# Patient Record
Sex: Female | Born: 1980 | Race: White | Hispanic: No | Smoking: Current every day smoker
Health system: Southern US, Community
[De-identification: ages and names within clinical notes are randomized; demographics above are authoritative.]

## PROBLEM LIST (undated history)

## (undated) DIAGNOSIS — R87629 Unspecified abnormal cytological findings in specimens from vagina: Secondary | ICD-10-CM

## (undated) DIAGNOSIS — F99 Mental disorder, not otherwise specified: Secondary | ICD-10-CM

## (undated) DIAGNOSIS — Z8669 Personal history of other diseases of the nervous system and sense organs: Secondary | ICD-10-CM

## (undated) DIAGNOSIS — B9689 Other specified bacterial agents as the cause of diseases classified elsewhere: Secondary | ICD-10-CM

## (undated) DIAGNOSIS — M549 Dorsalgia, unspecified: Secondary | ICD-10-CM

## (undated) DIAGNOSIS — M797 Fibromyalgia: Secondary | ICD-10-CM

## (undated) DIAGNOSIS — N760 Acute vaginitis: Secondary | ICD-10-CM

## (undated) DIAGNOSIS — M1990 Unspecified osteoarthritis, unspecified site: Secondary | ICD-10-CM

## (undated) DIAGNOSIS — I10 Essential (primary) hypertension: Secondary | ICD-10-CM

## (undated) DIAGNOSIS — R87810 Cervical high risk human papillomavirus (HPV) DNA test positive: Secondary | ICD-10-CM

## (undated) HISTORY — DX: Acute vaginitis: N76.0

## (undated) HISTORY — DX: Mental disorder, not otherwise specified: F99

## (undated) HISTORY — DX: Essential (primary) hypertension: I10

## (undated) HISTORY — PX: CHOLECYSTECTOMY: SHX55

## (undated) HISTORY — PX: TONSILLECTOMY: SUR1361

## (undated) HISTORY — DX: Unspecified abnormal cytological findings in specimens from vagina: R87.629

## (undated) HISTORY — DX: Other specified bacterial agents as the cause of diseases classified elsewhere: B96.89

## (undated) HISTORY — DX: Personal history of other diseases of the nervous system and sense organs: Z86.69

## (undated) HISTORY — DX: Cervical high risk human papillomavirus (HPV) DNA test positive: R87.810

## (undated) HISTORY — PX: BACK SURGERY: SHX140

---

## 2000-09-11 ENCOUNTER — Other Ambulatory Visit: Admission: RE | Admit: 2000-09-11 | Discharge: 2000-09-11 | Payer: Self-pay | Admitting: Obstetrics and Gynecology

## 2004-08-02 ENCOUNTER — Emergency Department (HOSPITAL_COMMUNITY): Admission: EM | Admit: 2004-08-02 | Discharge: 2004-08-02 | Payer: Self-pay | Admitting: Emergency Medicine

## 2007-02-24 ENCOUNTER — Emergency Department (HOSPITAL_COMMUNITY): Admission: EM | Admit: 2007-02-24 | Discharge: 2007-02-25 | Payer: Self-pay | Admitting: Emergency Medicine

## 2007-10-15 ENCOUNTER — Other Ambulatory Visit: Admission: RE | Admit: 2007-10-15 | Discharge: 2007-10-15 | Payer: Self-pay | Admitting: Obstetrics & Gynecology

## 2008-02-27 ENCOUNTER — Emergency Department (HOSPITAL_COMMUNITY): Admission: EM | Admit: 2008-02-27 | Discharge: 2008-02-27 | Payer: Self-pay | Admitting: Emergency Medicine

## 2008-10-01 ENCOUNTER — Ambulatory Visit (HOSPITAL_COMMUNITY): Admission: RE | Admit: 2008-10-01 | Discharge: 2008-10-01 | Payer: Self-pay | Admitting: Family Medicine

## 2008-10-20 ENCOUNTER — Ambulatory Visit (HOSPITAL_COMMUNITY): Admission: RE | Admit: 2008-10-20 | Discharge: 2008-10-20 | Payer: Self-pay | Admitting: Family Medicine

## 2009-02-11 ENCOUNTER — Inpatient Hospital Stay (HOSPITAL_COMMUNITY): Admission: RE | Admit: 2009-02-11 | Discharge: 2009-02-14 | Payer: Self-pay | Admitting: Neurosurgery

## 2009-03-01 ENCOUNTER — Emergency Department (HOSPITAL_COMMUNITY): Admission: EM | Admit: 2009-03-01 | Discharge: 2009-03-01 | Payer: Self-pay | Admitting: Emergency Medicine

## 2009-05-21 ENCOUNTER — Other Ambulatory Visit: Admission: RE | Admit: 2009-05-21 | Discharge: 2009-05-21 | Payer: Self-pay | Admitting: Obstetrics and Gynecology

## 2009-11-05 ENCOUNTER — Emergency Department (HOSPITAL_COMMUNITY): Admission: EM | Admit: 2009-11-05 | Discharge: 2009-11-05 | Payer: Self-pay | Admitting: Emergency Medicine

## 2010-05-20 IMAGING — CR DG ABDOMEN 1V
1 series · 1 of 1 positions shown · non-contrast
Comparison: Lumbar spine radiographs 10/01/2008

CLINICAL DATA: Constipation

ABDOMEN - 1 VIEW

[view not recorded]
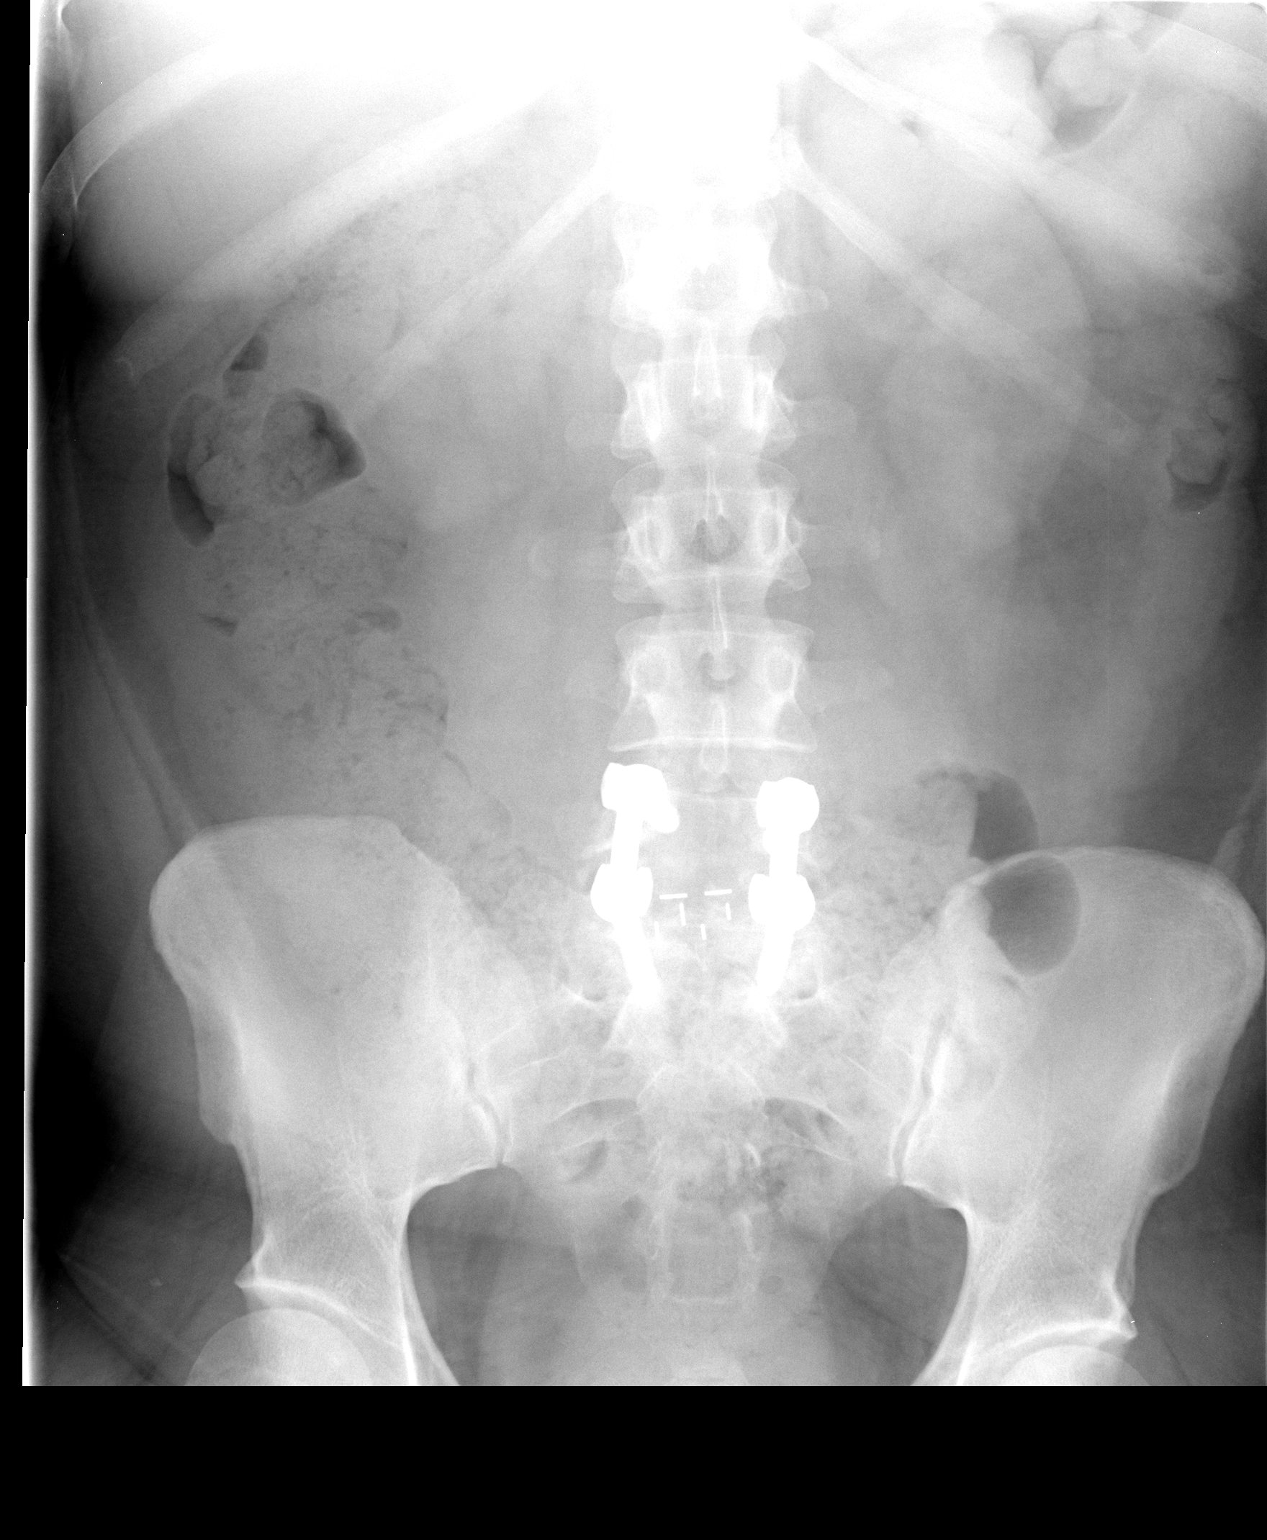

[1 of 1 positions shown; findings below may reference images not displayed]

FINDINGS: Evidence of L5 - S1 lumbar fusion noted.  Moderate amount
of stool noted over nondilated colon.  Presence or absence of air
fluid levels or free air cannot be assessed on this supine
projection.
IMPRESSION: Moderate stool burden.  No acute finding otherwise.

## 2010-07-06 ENCOUNTER — Other Ambulatory Visit (HOSPITAL_COMMUNITY): Payer: Self-pay | Admitting: Family Medicine

## 2010-07-06 DIAGNOSIS — R1011 Right upper quadrant pain: Secondary | ICD-10-CM

## 2010-07-07 ENCOUNTER — Ambulatory Visit (HOSPITAL_COMMUNITY)
Admission: RE | Admit: 2010-07-07 | Discharge: 2010-07-07 | Disposition: A | Discharge status: Home / Self Care | Payer: BC Managed Care – PPO | Source: Ambulatory Visit | Attending: Family Medicine | Admitting: Family Medicine

## 2010-07-07 DIAGNOSIS — R1011 Right upper quadrant pain: Secondary | ICD-10-CM

## 2010-07-07 DIAGNOSIS — K829 Disease of gallbladder, unspecified: Secondary | ICD-10-CM | POA: Insufficient documentation

## 2010-07-12 ENCOUNTER — Other Ambulatory Visit (HOSPITAL_COMMUNITY): Payer: Self-pay | Admitting: Family Medicine

## 2010-07-12 ENCOUNTER — Encounter (HOSPITAL_COMMUNITY): Payer: Self-pay

## 2010-07-12 ENCOUNTER — Ambulatory Visit (HOSPITAL_COMMUNITY)
Admission: RE | Admit: 2010-07-12 | Discharge: 2010-07-12 | Disposition: A | Discharge status: Home / Self Care | Payer: BC Managed Care – PPO | Source: Ambulatory Visit | Attending: Family Medicine | Admitting: Family Medicine

## 2010-07-12 DIAGNOSIS — R1011 Right upper quadrant pain: Secondary | ICD-10-CM | POA: Insufficient documentation

## 2010-07-12 DIAGNOSIS — R932 Abnormal findings on diagnostic imaging of liver and biliary tract: Secondary | ICD-10-CM | POA: Insufficient documentation

## 2010-07-12 MED ORDER — TECHNETIUM TC 99M MEBROFENIN IV KIT
5.0000 | PACK | Freq: Once | INTRAVENOUS | Status: AC | PRN
  Administered 2010-07-12: 4.9 via INTRAVENOUS

## 2010-07-16 ENCOUNTER — Encounter (HOSPITAL_COMMUNITY): Payer: BC Managed Care – PPO

## 2010-07-16 ENCOUNTER — Other Ambulatory Visit: Payer: Self-pay | Admitting: General Surgery

## 2010-07-16 LAB — BASIC METABOLIC PANEL
BUN: 10 mg/dL (ref 6–23)
Creatinine, Ser: 0.89 mg/dL (ref 0.4–1.2)
GFR calc Af Amer: >60 mL/min (ref >60)
Glucose, Bld: 106 mg/dL — ABNORMAL HIGH (ref 70–99)
Sodium: 139 mEq/L (ref 135–145)

## 2010-07-16 LAB — HEPATIC FUNCTION PANEL
ALT: 18 U/L (ref 0–35)
Albumin: 3.9 g/dL (ref 3.5–5.2)
Alkaline Phosphatase: 58 U/L (ref 39–117)
Bilirubin, Direct: 0.2 mg/dL (ref 0.0–0.3)
Indirect Bilirubin: 0.3 mg/dL (ref 0.3–0.9)
Total Bilirubin: 0.5 mg/dL (ref 0.3–1.2)
Total Protein: 6.3 g/dL (ref 6.0–8.3)

## 2010-07-16 LAB — CBC
MCH: 30.3 pg (ref 26.0–34.0)
MCV: 88.6 fL (ref 78.0–100.0)
Platelets: 262 10*3/uL (ref 150–400)
RBC: 4.66 MIL/uL (ref 3.87–5.11)
RDW: 12.9 % (ref 11.5–15.5)
WBC: 10.6 10*3/uL — ABNORMAL HIGH (ref 4.0–10.5)

## 2010-07-16 LAB — SURGICAL PCR SCREEN: Staphylococcus aureus: NEGATIVE

## 2010-07-16 LAB — HCG, QUANTITATIVE, PREGNANCY: hCG, Beta Chain, Quant, S: <2 m[IU]/mL (ref <5)

## 2010-07-17 NOTE — H&P (Signed)
  Jennifer Gonzales, Jennifer Gonzales         ACCOUNT NO.:  [id number]  MEDICAL RECORD NO.:  [id number]           PATIENT TYPE:  O  LOCATION:  DAY                           FACILITY:  [facility]  PHYSICIAN:  Dalia Heading, M.D.  DATE OF BIRTH:  Jun 10, 1980  DATE OF ADMISSION: DATE OF DISCHARGE:  LH                             HISTORY & PHYSICAL   CHIEF COMPLAINT:  Chronic cholecystitis.  HISTORY OF PRESENT ILLNESS:  The patient is a 30 year old white female who is referred for evaluation and treatment of biliary colic secondary to chronic cholecystitis.  She has been having intermittent right upper quadrant abdominal pain with radiation to the flank, nausea, and indigestion for the past few weeks.  It is made worse with fatty foods. No fever, chills, jaundice have been noted.  PAST MEDICAL HISTORY:  Includes fibromyalgia.  PAST SURGICAL HISTORY:  C-sections, back surgery.  CURRENT MEDICATIONS:  Topiramate 25 mg p.o. at bedtime.  ALLERGIES:  TORADOL.  REVIEW OF SYSTEMS:  The patient does smoke tobacco socially.  She drinks alcohol socially.  She denies any other cardiopulmonary difficulties or bleeding disorders.  FAMILY MEDICAL HISTORY:  Noncontributory.  PHYSICAL EXAMINATION:  GENERAL:  The patient is a well-developed, well- nourished white female, in no acute distress. HEENT:  Reveals no scleral icterus. LUNGS:  Clear to auscultation with equal breath sounds bilaterally. HEART:  Reveals regular rate and rhythm without S3, S4, or murmurs. ABDOMEN:  Soft, nondistended.  She is tender in the right upper quadrant to palpation.  No hepatosplenomegaly, masses, hernias identified.  Ultrasound of the gallbladder is reportedly negative.  HIDA scan reveals chronic cholecystitis with a low gallbladder ejection fraction reproducible symptoms.  IMPRESSION:  Chronic cholecystitis.  PLAN:  The patient is scheduled for laparoscopic cholecystectomy on July 19, 2010.  The risks and benefits of the  procedure including bleeding, infection, hepatobiliary injury, and the possibility of an open procedure were fully explained to the patient, gave informed consent.     Dalia Heading, M.D.     MAJ/MEDQ  D:  07/15/2010  T:  07/16/2010  Job:  [id number]  cc:   Short Stay, Nadara Mode, M.D. Fax: [phone]  Electronically Signed by Franky Macho M.D. on 07/17/2010 10:05:41 AM

## 2010-07-19 ENCOUNTER — Ambulatory Visit (HOSPITAL_COMMUNITY)
Admission: RE | Admit: 2010-07-19 | Discharge: 2010-07-19 | Disposition: A | Discharge status: Home / Self Care | Payer: BC Managed Care – PPO | Source: Ambulatory Visit | Attending: General Surgery | Admitting: General Surgery

## 2010-07-19 ENCOUNTER — Other Ambulatory Visit: Payer: Self-pay | Admitting: General Surgery

## 2010-07-19 DIAGNOSIS — K811 Chronic cholecystitis: Secondary | ICD-10-CM | POA: Insufficient documentation

## 2010-07-19 DIAGNOSIS — Z01812 Encounter for preprocedural laboratory examination: Secondary | ICD-10-CM | POA: Insufficient documentation

## 2010-07-19 DIAGNOSIS — Z01818 Encounter for other preprocedural examination: Secondary | ICD-10-CM | POA: Insufficient documentation

## 2010-07-22 NOTE — Op Note (Signed)
Jennifer Gonzales, Jennifer Gonzales         ACCOUNT NO.:  [id number]  MEDICAL RECORD NO.:  [id number]           PATIENT TYPE:  O  LOCATION:  DAYP                          FACILITY:  [facility]  PHYSICIAN:  Dalia Heading, M.D.  DATE OF BIRTH:  [birth year]  DATE OF PROCEDURE:  07/19/2010 DATE OF DISCHARGE:                              OPERATIVE REPORT   PREOPERATIVE DIAGNOSIS:  Chronic cholecystitis.  POSTOPERATIVE DIAGNOSIS:  Chronic cholecystitis.  PROCEDURE:  Laparoscopic cholecystectomy.  SURGEON:  Dalia Heading, MD  ANESTHESIA:  General endotracheal.  INDICATIONS:  The patient is a 30 year old white female who presents with biliary colic secondary to chronic cholecystitis.  Risks and benefits of the procedure including bleeding, infection, hepatobiliary injury, and the possibility of an open procedure were fully explained to the patient, gave informed consent.  PROCEDURE NOTE:  The patient was placed in supine position.  After induction of general endotracheal anesthesia, the abdomen was prepped and draped in the usual sterile technique with DuraPrep.  Surgical site confirmation was performed.  The supraumbilical incision was made down to the fascia.  A Veress needle was introduced into the abdominal cavity and confirmation of placement was done using the saline drop test.  The abdomen was then insufflated to 16 mmHg of pressure.  An #11 mm trocar was introduced into the abdominal cavity under direct visualization without difficulty. The patient was placed in a reversed Trendelenburg position.  An additional 11-mm trocar was placed in the epigastric region and 5-mm trocars were placed in the right upper quadrant and right flank regions. Liver was inspected and noted to be within normal limits.  The gallbladder was retracted superiorly and laterally.  Dissection was begun around the infundibulum of the gallbladder.  The cystic duct was first identified.  She juncture to the  infundibulum was fully identified.  Endoclip was placed proximally and distally on the cystic duct and the cystic duct was divided.  This likewise was done on the cystic artery.  The gallbladder was then freed away from the gallbladder fossa using Bovie electrocautery.  The gallbladder was delivered through the epigastric trocar site using Endocatch bag.  The gallbladder fossa was inspected and no abnormal bleeding or bile leakage was noted. Surgicel was placed in the gallbladder fossa.  All fluid and air were then evacuated from the abdominal cavity prior to removal of the trocars.  All wounds were irrigated with normal saline.  All wounds were injected with 0.5% Sensorcaine.  The supraumbilical fascia was reapproximated using an 0 Vicryl interrupted suture.  All skin incisions were closed using staples.  Betadine ointment and dry sterile dressings were applied.  All tape and needle counts were correct at the end of the procedure. The patient was extubated in the operating room and went back to the recovery room awake and stable condition.  COMPLICATIONS:  None.  SPECIMEN:  Gallbladder.  BLOOD LOSS:  Minimal.     Dalia Heading, M.D.     MAJ/MEDQ  D:  07/19/2010  T:  07/20/2010  Job:  [id number]  cc:   Kirk Ruths, M.D. Fax: [phone]  Electronically Signed by Franky Macho M.D. on 07/21/2010  08:24:06 AM

## 2010-08-20 LAB — TYPE AND SCREEN
ABO/RH(D): O POS
Antibody Screen: NEGATIVE

## 2010-08-20 LAB — CBC
Hemoglobin: 15.2 g/dL — ABNORMAL HIGH (ref 12.0–15.0)
Platelets: 240 10*3/uL (ref 150–400)

## 2010-08-20 LAB — ABO/RH: ABO/RH(D): O POS

## 2011-02-24 LAB — PREGNANCY, URINE: Preg Test, Ur: NEGATIVE

## 2011-03-12 DIAGNOSIS — F17200 Nicotine dependence, unspecified, uncomplicated: Secondary | ICD-10-CM | POA: Insufficient documentation

## 2011-03-12 DIAGNOSIS — J40 Bronchitis, not specified as acute or chronic: Secondary | ICD-10-CM | POA: Insufficient documentation

## 2011-03-12 DIAGNOSIS — M2669 Other specified disorders of temporomandibular joint: Secondary | ICD-10-CM | POA: Insufficient documentation

## 2011-03-12 DIAGNOSIS — R05 Cough: Secondary | ICD-10-CM | POA: Insufficient documentation

## 2011-03-12 DIAGNOSIS — IMO0001 Reserved for inherently not codable concepts without codable children: Secondary | ICD-10-CM | POA: Insufficient documentation

## 2011-03-12 DIAGNOSIS — R059 Cough, unspecified: Secondary | ICD-10-CM | POA: Insufficient documentation

## 2011-03-13 ENCOUNTER — Emergency Department (HOSPITAL_COMMUNITY)
Admission: EM | Admit: 2011-03-13 | Discharge: 2011-03-13 | Disposition: A | Discharge status: Home / Self Care | Payer: BC Managed Care – PPO | Attending: Emergency Medicine | Admitting: Emergency Medicine

## 2011-03-13 ENCOUNTER — Encounter (HOSPITAL_COMMUNITY): Payer: Self-pay | Admitting: *Deleted

## 2011-03-13 DIAGNOSIS — M26629 Arthralgia of temporomandibular joint, unspecified side: Secondary | ICD-10-CM

## 2011-03-13 DIAGNOSIS — J40 Bronchitis, not specified as acute or chronic: Secondary | ICD-10-CM

## 2011-03-13 HISTORY — DX: Dorsalgia, unspecified: M54.9

## 2011-03-13 HISTORY — DX: Fibromyalgia: M79.7

## 2011-03-13 LAB — RAPID STREP SCREEN (MED CTR MEBANE ONLY): Streptococcus, Group A Screen (Direct): NEGATIVE

## 2011-03-13 MED ORDER — HYDROCOD POLST-CHLORPHEN POLST 10-8 MG/5ML PO LQCR: ORAL | Status: DC

## 2011-03-13 NOTE — ED Notes (Signed)
Pt reports sore throat x 2 weeks  

## 2011-03-13 NOTE — ED Provider Notes (Signed)
History     CSN: [id number] Arrival date & time: 03/13/2011 12:17 AM   First MD Initiated Contact with Patient 03/13/11 0114      Chief Complaint  Patient presents with  . Sore Throat    (Consider location/radiation/quality/duration/timing/severity/associated sxs/prior treatment) HPI This is a 30 year old white female with a two-week history of respiratory symptoms. These include cough, nasal congestion and sore throat. The symptoms are persisting. She's not had a fever. She states the sore throat is primarily felt in her jaw bilaterally, worse with movement of the jaw. Her daughter, who accompanies her, tested positive for strep throat but the patient's strep test was negative.  Past Medical History  Diagnosis Date  . Fibromyalgia   . Back pain     Past Surgical History  Procedure Date  . Cholecystectomy   . Back surgery   . Tonsillectomy   . Cesarean section     No family history on file.  History  Substance Use Topics  . Smoking status: Current Some Day Smoker  . Smokeless tobacco: Not on file  . Alcohol Use: No    OB History    Grav Para Term Preterm Abortions TAB SAB Ect Mult Living                  Review of Systems  All other systems reviewed and are negative.    Allergies  Toradol  Home Medications   Current Outpatient Rx  Name Route Sig Dispense Refill  . CETIRIZINE HCL 10 MG PO TABS Oral Take 10 mg by mouth daily.      Marland Kitchen ESCITALOPRAM OXALATE 10 MG PO TABS Oral Take 10 mg by mouth daily.      Marland Kitchen MELATONIN 10 MG PO TABS Oral Take by mouth at bedtime.      . TOPIRAMATE 50 MG PO TABS Oral Take 50 mg by mouth once.        BP 118/83  Pulse 94  Temp(Src) 98.5 F (36.9 C) (Oral)  Resp 20  Ht 4\' 11"  (1.499 m)  Wt 187 lb (84.823 kg)  BMI 37.77 kg/m2  SpO2 99%  Physical Exam General: Well-developed, well-nourished female in no acute distress; appearance consistent with age of record HENT: normocephalic, atraumatic; no pharyngeal erythema or  exudate; mild TMJ tenderness on movement of jaw Eyes: pupils equal round and reactive to light; extraocular muscles intact Neck: supple; no cervical   the cervical rectum lymphadenopathy Heart: regular rate and rhythm Lungs: clear to auscultation bilaterally Abdomen: soft; nontender; nondistended; no masses or hepatosplenomegaly Extremities: No deformity; full range of motion Neurologic: Awake, alert and oriented;motor function intact in all extremities and symmetric; no facial droop Skin: Warm and dry Psychiatric: Normal mood and affect    ED Course  Procedures (including critical care time)    MDM   Nursing notes and vitals signs, including pulse oximetry, reviewed.  Summary of this visit's results, reviewed by myself:  Labs:  Results for orders placed during the hospital encounter of 03/13/11  RAPID STREP SCREEN      Component Value Range   Streptococcus, Group A Screen (Direct) NEGATIVE  NEGATIVE    Suspect persistent cough is due to viral bronchitis. This has been seen commonly in the community the past several weeks. 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          Hanley Seamen, MD 03/13/11 [phone]

## 2011-03-13 NOTE — Discharge Instructions (Signed)
Bronchitis Bronchitis is the body's way of reacting to injury and/or infection (inflammation) of the bronchi. Bronchi are the air tubes that extend from the windpipe into the lungs. If the inflammation becomes severe, it may cause shortness of breath. CAUSES  Inflammation may be caused by:  A virus.   Germs (bacteria).   Dust.   Allergens.   Pollutants and many other irritants.  The cells lining the bronchial tree are covered with tiny hairs (cilia). These constantly beat upward, away from the lungs, toward the mouth. This keeps the lungs free of pollutants. When these cells become too irritated and are unable to do their job, mucus begins to develop. This causes the characteristic cough of bronchitis. The cough clears the lungs when the cilia are unable to do their job. Without either of these protective mechanisms, the mucus would settle in the lungs. Then you would develop pneumonia. Smoking is a common cause of bronchitis and can contribute to pneumonia. Stopping this habit is the single most important thing you can do to help yourself. TREATMENT   Your caregiver may prescribe an antibiotic if the cough is caused by bacteria. Also, medicines that open up your airways make it easier to breathe. Your caregiver may also recommend or prescribe an expectorant. It will loosen the mucus to be coughed up. Only take over-the-counter or prescription medicines for pain, discomfort, or fever as directed by your caregiver.   Removing whatever causes the problem (smoking, for example) is critical to preventing the problem from getting worse.   Cough suppressants may be prescribed for relief of cough symptoms.   Inhaled medicines may be prescribed to help with symptoms now and to help prevent problems from returning.   For those with recurrent (chronic) bronchitis, there may be a need for steroid medicines.  SEEK IMMEDIATE MEDICAL CARE IF:   During treatment, you develop more pus-like mucus  (purulent sputum).   You have a fever.   You become progressively more ill.   You have increased difficulty breathing, wheezing, or shortness of breath.  It is necessary to seek immediate medical care if you are elderly or sick from any other disease. MAKE SURE YOU:   Understand these instructions.   Will watch your condition.   Will get help right away if you are not doing well or get worse.  Document Released: 05/02/2005 Document Revised: 01/12/2011 Document Reviewed: 03/11/2008 [employer] Patient Information 2012 [employer]. 

## 2011-06-09 ENCOUNTER — Other Ambulatory Visit: Payer: Self-pay | Admitting: Adult Health

## 2011-06-09 ENCOUNTER — Other Ambulatory Visit (HOSPITAL_COMMUNITY)
Admission: RE | Admit: 2011-06-09 | Discharge: 2011-06-09 | Disposition: A | Discharge status: Home / Self Care | Payer: BC Managed Care – PPO | Source: Ambulatory Visit | Attending: Obstetrics and Gynecology | Admitting: Obstetrics and Gynecology

## 2011-06-09 DIAGNOSIS — Z1159 Encounter for screening for other viral diseases: Secondary | ICD-10-CM | POA: Insufficient documentation

## 2011-06-09 DIAGNOSIS — Z01419 Encounter for gynecological examination (general) (routine) without abnormal findings: Secondary | ICD-10-CM | POA: Insufficient documentation

## 2011-12-15 DIAGNOSIS — R6884 Jaw pain: Secondary | ICD-10-CM | POA: Insufficient documentation

## 2011-12-15 DIAGNOSIS — Z79899 Other long term (current) drug therapy: Secondary | ICD-10-CM | POA: Insufficient documentation

## 2011-12-15 DIAGNOSIS — IMO0001 Reserved for inherently not codable concepts without codable children: Secondary | ICD-10-CM | POA: Insufficient documentation

## 2011-12-15 DIAGNOSIS — Z886 Allergy status to analgesic agent status: Secondary | ICD-10-CM | POA: Insufficient documentation

## 2011-12-15 DIAGNOSIS — F17200 Nicotine dependence, unspecified, uncomplicated: Secondary | ICD-10-CM | POA: Insufficient documentation

## 2011-12-15 NOTE — ED Notes (Signed)
Right upper jaw pain since Sunday, painful to chew, using otc meds without relief.

## 2011-12-16 ENCOUNTER — Emergency Department (HOSPITAL_COMMUNITY)
Admission: EM | Admit: 2011-12-16 | Discharge: 2011-12-16 | Disposition: A | Discharge status: Home / Self Care | Payer: BC Managed Care – PPO | Attending: Emergency Medicine | Admitting: Emergency Medicine

## 2011-12-16 ENCOUNTER — Encounter (HOSPITAL_COMMUNITY): Payer: Self-pay | Admitting: *Deleted

## 2011-12-16 DIAGNOSIS — M26629 Arthralgia of temporomandibular joint, unspecified side: Secondary | ICD-10-CM

## 2011-12-16 MED ORDER — OXYCODONE-ACETAMINOPHEN 5-325 MG PO TABS: 2.0000 | ORAL_TABLET | ORAL | Status: AC | PRN

## 2011-12-16 MED ORDER — IBUPROFEN 800 MG PO TABS
800.0000 mg | ORAL_TABLET | Freq: Once | ORAL | Status: AC
  Administered 2011-12-16: 800 mg via ORAL
  Filled 2011-12-16: qty 1

## 2011-12-16 NOTE — ED Notes (Signed)
Pt alert & oriented x4, stable gait. Patient given discharge instructions, paperwork & prescription(s). Patient  instructed to stop at the registration desk to finish any additional paperwork. Patient verbalized understanding. Pt left department w/ no further questions. 

## 2011-12-16 NOTE — ED Provider Notes (Signed)
History     CSN: [id number]  Arrival date & time 12/15/11  2334   First MD Initiated Contact with Patient 12/16/11 [phone]      Chief Complaint  Patient presents with  . Jaw Pain    (Consider location/radiation/quality/duration/timing/severity/associated sxs/prior treatment) HPI Pt reports several days of moderate to severe aching pain in R jaw, worse with chewing. No injury, no toothache. 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 Has tried Motrin 400mg  without improvement.   Past Medical History  Diagnosis Date  . Fibromyalgia   . Back pain     Past Surgical History  Procedure Date  . Cholecystectomy   . Back surgery   . Tonsillectomy   . Cesarean section     No family history on file.  History  Substance Use Topics  . Smoking status: Current Some Day Smoker  . Smokeless tobacco: Not on file  . Alcohol Use: No    OB History    Grav Para Term Preterm Abortions TAB SAB Ect Mult Living                  Review of Systems All other systems reviewed and are negative except as noted in HPI.   Allergies  Ketorolac tromethamine  Home Medications   Current Outpatient Rx  Name Route Sig Dispense Refill  . ACETAMINOPHEN 325 MG PO TABS Oral Take 650 mg by mouth every 6 (six) hours as needed.    Marland Kitchen CETIRIZINE HCL 10 MG PO TABS Oral Take 10 mg by mouth daily.      Marland Kitchen ESCITALOPRAM OXALATE 10 MG PO TABS Oral Take 10 mg by mouth daily.      . IBUPROFEN 200 MG PO TABS Oral Take 200 mg by mouth every 6 (six) hours as needed.    . NORETHINDRONE 0.35 MG PO TABS Oral Take 1 tablet by mouth daily.    . TOPIRAMATE 50 MG PO TABS Oral Take 50 mg by mouth once.      Marland Kitchen HYDROCOD POLST-CPM POLST ER 10-8 MG/5ML PO LQCR  Take 5 L every 12 hours as needed for cough. 115 mL 0  . MELATONIN 10 MG PO TABS Oral Take by mouth at bedtime.        BP 110/74  Pulse 89  Temp 98.4 F (36.9 C) (Oral)  Resp 16  SpO2 100%  LMP 12/05/2011  Physical Exam  Constitutional: She is oriented to person,  place, and time. She appears well-developed and well-nourished.  HENT:  Head: Normocephalic and atraumatic.    Right Ear: Tympanic membrane, external ear and ear canal normal.  Left Ear: Tympanic membrane, external ear and ear canal normal.  Neck: Neck supple.  Pulmonary/Chest: Effort normal.  Neurological: She is alert and oriented to person, place, and time. No cranial nerve deficit.  Psychiatric: She has a normal mood and affect. Her behavior is normal.    ED Course  Procedures (including critical care time)  Labs Reviewed - No data to display No results found.   No diagnosis found.    MDM  Symptoms consistent with TMJ. Advised NSAIDs (which she can take despite allergy to Toradol listed), percocet for breakthrough. PCP followup.         Tu Bayle B. Bernette Mayers, MD 12/16/11 [phone]

## 2012-03-11 ENCOUNTER — Emergency Department (HOSPITAL_COMMUNITY)
Admission: EM | Admit: 2012-03-11 | Discharge: 2012-03-11 | Disposition: A | Discharge status: Home / Self Care | Payer: BC Managed Care – PPO | Attending: Emergency Medicine | Admitting: Emergency Medicine

## 2012-03-11 ENCOUNTER — Encounter (HOSPITAL_COMMUNITY): Payer: Self-pay | Admitting: Emergency Medicine

## 2012-03-11 ENCOUNTER — Emergency Department (HOSPITAL_COMMUNITY): Payer: BC Managed Care – PPO

## 2012-03-11 DIAGNOSIS — S8390XA Sprain of unspecified site of unspecified knee, initial encounter: Secondary | ICD-10-CM

## 2012-03-11 DIAGNOSIS — Z8739 Personal history of other diseases of the musculoskeletal system and connective tissue: Secondary | ICD-10-CM | POA: Insufficient documentation

## 2012-03-11 DIAGNOSIS — Y929 Unspecified place or not applicable: Secondary | ICD-10-CM | POA: Insufficient documentation

## 2012-03-11 DIAGNOSIS — F17200 Nicotine dependence, unspecified, uncomplicated: Secondary | ICD-10-CM | POA: Insufficient documentation

## 2012-03-11 DIAGNOSIS — IMO0002 Reserved for concepts with insufficient information to code with codable children: Secondary | ICD-10-CM | POA: Insufficient documentation

## 2012-03-11 DIAGNOSIS — Y9351 Activity, roller skating (inline) and skateboarding: Secondary | ICD-10-CM | POA: Insufficient documentation

## 2012-03-11 MED ORDER — OXYCODONE-ACETAMINOPHEN 5-325 MG PO TABS: 1.0000 | ORAL_TABLET | ORAL | Status: DC | PRN

## 2012-03-11 MED ORDER — IBUPROFEN 800 MG PO TABS
800.0000 mg | ORAL_TABLET | Freq: Once | ORAL | Status: AC
  Administered 2012-03-11: 800 mg via ORAL
  Filled 2012-03-11: qty 1

## 2012-03-11 NOTE — ED Notes (Signed)
Pt discharged. Pt stable at time of discharge. Medications reviewed pt has no questions regarding discharge at this time. Pt voiced understanding of discharge instructions.  

## 2012-03-11 NOTE — ED Notes (Signed)
Patient states she fell while roller skating yesterday and is now complaining of left knee pain.

## 2012-03-11 NOTE — ED Provider Notes (Signed)
History     CSN: [id number]  Arrival date & time 03/11/12  0416   First MD Initiated Contact with Patient 03/11/12 [phone]      Chief Complaint  Patient presents with  . Knee Pain    Patient is a 31 y.o. female presenting with knee pain. The history is provided by the patient.  Knee Pain This is a new problem. The current episode started yesterday. The problem occurs constantly. The problem has been gradually worsening. Pertinent negatives include no chest pain, no headaches and no shortness of breath. The symptoms are aggravated by bending and standing. The symptoms are relieved by rest. She has tried rest for the symptoms. The treatment provided no relief.  pt presents with left knee pain She reports she was rollerskating yesterday and fell onto her left knee.  She reports since then her pain has worsened  She denies head injury.  No neck or back injury.  No other complaints reported  Past Medical History  Diagnosis Date  . Fibromyalgia   . Back pain     Past Surgical History  Procedure Date  . Cholecystectomy   . Back surgery   . Tonsillectomy   . Cesarean section     History reviewed. No pertinent family history.  History  Substance Use Topics  . Smoking status: Current Some Day Smoker  . Smokeless tobacco: Not on file  . Alcohol Use: No    OB History    Grav Para Term Preterm Abortions TAB SAB Ect Mult Living                  Review of Systems  Respiratory: Negative for shortness of breath.   Cardiovascular: Negative for chest pain.  Neurological: Negative for headaches.    Allergies  Ketorolac tromethamine  Home Medications   Current Outpatient Rx  Name Route Sig Dispense Refill  . ACETAMINOPHEN 325 MG PO TABS Oral Take 650 mg by mouth every 6 (six) hours as needed.    Marland Kitchen CETIRIZINE HCL 10 MG PO TABS Oral Take 10 mg by mouth daily.      Marland Kitchen HYDROCOD POLST-CPM POLST ER 10-8 MG/5ML PO LQCR  Take 5 L every 12 hours as needed for cough. 115 mL 0  .  ESCITALOPRAM OXALATE 10 MG PO TABS Oral Take 10 mg by mouth daily.      . IBUPROFEN 200 MG PO TABS Oral Take 200 mg by mouth every 6 (six) hours as needed.    Marland Kitchen MELATONIN 10 MG PO TABS Oral Take by mouth at bedtime.      . NORETHINDRONE 0.35 MG PO TABS Oral Take 1 tablet by mouth daily.    . TOPIRAMATE 50 MG PO TABS Oral Take 50 mg by mouth once.        BP 116/72  Pulse 96  Temp 98.3 F (36.8 C) (Oral)  Resp 18  Ht 4' 11.5" (1.511 m)  Wt 205 lb (92.987 kg)  BMI 40.71 kg/m2  SpO2 96%  LMP 02/25/2012  Physical Exam CONSTITUTIONAL: Well developed/well nourished HEAD AND FACE: Normocephalic/atraumatic EYES: EOMI/PERRL ENMT: Mucous membranes moist NECK: supple no meningeal signs SPINE:entire spine nontender CV: S1/S2 noted, no murmurs/rubs/gallops noted LUNGS: Lungs are clear to auscultation bilaterally, no apparent distress ABDOMEN: soft, nontender, no rebound or guarding NEURO: Pt is awake/alert, moves all extremitiesx4 EXTREMITIES: pulses normal, tenderness and erythema to left knee.  No deformity or significant signs of effusion noted.  She can keep knee extended and flex at  left hip, but she has limited ROM of left knee due to pain.  The rest of her left lower extremity is nontender and atraumatic SKIN: warm, color normal PSYCH: no abnormalities of mood noted  ED Course  Procedures   Pt stable for d/c home.  Ortho f/u advised.  Crutches offered.     MDM  Nursing notes including past medical history and social history reviewed and considered in documentation xrays reviewed and considered         Joya Gaskins, MD 03/11/12 [phone]

## 2012-04-05 ENCOUNTER — Emergency Department (HOSPITAL_COMMUNITY)
Admission: EM | Admit: 2012-04-05 | Discharge: 2012-04-06 | Disposition: A | Discharge status: Home / Self Care | Payer: BC Managed Care – PPO | Attending: Emergency Medicine | Admitting: Emergency Medicine

## 2012-04-05 ENCOUNTER — Other Ambulatory Visit: Payer: Self-pay

## 2012-04-05 ENCOUNTER — Encounter (HOSPITAL_COMMUNITY): Payer: Self-pay | Admitting: *Deleted

## 2012-04-05 DIAGNOSIS — T4272XA Poisoning by unspecified antiepileptic and sedative-hypnotic drugs, intentional self-harm, initial encounter: Secondary | ICD-10-CM | POA: Insufficient documentation

## 2012-04-05 DIAGNOSIS — M549 Dorsalgia, unspecified: Secondary | ICD-10-CM | POA: Insufficient documentation

## 2012-04-05 DIAGNOSIS — F17200 Nicotine dependence, unspecified, uncomplicated: Secondary | ICD-10-CM | POA: Insufficient documentation

## 2012-04-05 DIAGNOSIS — T426X1A Poisoning by other antiepileptic and sedative-hypnotic drugs, accidental (unintentional), initial encounter: Secondary | ICD-10-CM | POA: Insufficient documentation

## 2012-04-05 DIAGNOSIS — IMO0001 Reserved for inherently not codable concepts without codable children: Secondary | ICD-10-CM | POA: Insufficient documentation

## 2012-04-05 DIAGNOSIS — T50901A Poisoning by unspecified drugs, medicaments and biological substances, accidental (unintentional), initial encounter: Secondary | ICD-10-CM

## 2012-04-05 DIAGNOSIS — R110 Nausea: Secondary | ICD-10-CM | POA: Insufficient documentation

## 2012-04-05 DIAGNOSIS — Z79899 Other long term (current) drug therapy: Secondary | ICD-10-CM | POA: Insufficient documentation

## 2012-04-05 DIAGNOSIS — F329 Major depressive disorder, single episode, unspecified: Secondary | ICD-10-CM

## 2012-04-05 DIAGNOSIS — Z3202 Encounter for pregnancy test, result negative: Secondary | ICD-10-CM | POA: Insufficient documentation

## 2012-04-05 DIAGNOSIS — T4271XA Poisoning by unspecified antiepileptic and sedative-hypnotic drugs, accidental (unintentional), initial encounter: Secondary | ICD-10-CM | POA: Insufficient documentation

## 2012-04-05 DIAGNOSIS — T426X2A Poisoning by other antiepileptic and sedative-hypnotic drugs, intentional self-harm, initial encounter: Secondary | ICD-10-CM | POA: Insufficient documentation

## 2012-04-05 DIAGNOSIS — T50992A Poisoning by other drugs, medicaments and biological substances, intentional self-harm, initial encounter: Secondary | ICD-10-CM | POA: Insufficient documentation

## 2012-04-05 LAB — COMPREHENSIVE METABOLIC PANEL
ALT: 12 U/L (ref 0–35)
AST: 15 U/L (ref 0–37)
Albumin: 4.4 g/dL (ref 3.5–5.2)
CO2: 21 mEq/L (ref 19–32)
Calcium: 9.3 mg/dL (ref 8.4–10.5)
GFR calc non Af Amer: >90 mL/min (ref >90)
Sodium: 134 mEq/L — ABNORMAL LOW (ref 135–145)

## 2012-04-05 LAB — CBC
MCH: 32.1 pg (ref 26.0–34.0)
Platelets: 211 10*3/uL (ref 150–400)
RBC: 4.49 MIL/uL (ref 3.87–5.11)
RDW: 12.3 % (ref 11.5–15.5)
WBC: 15.3 10*3/uL — ABNORMAL HIGH (ref 4.0–10.5)

## 2012-04-05 LAB — RAPID URINE DRUG SCREEN, HOSP PERFORMED
Amphetamines: NOT DETECTED
Benzodiazepines: NOT DETECTED
Cocaine: NOT DETECTED
Opiates: NOT DETECTED
Tetrahydrocannabinol: NOT DETECTED

## 2012-04-05 LAB — SALICYLATE LEVEL: Salicylate Lvl: <2 mg/dL — ABNORMAL LOW (ref 2.8–20.0)

## 2012-04-05 MED ORDER — ONDANSETRON HCL 4 MG/2ML IJ SOLN
4.0000 mg | Freq: Once | INTRAMUSCULAR | Status: AC
  Administered 2012-04-05: 4 mg via INTRAVENOUS
  Filled 2012-04-05: qty 2

## 2012-04-05 NOTE — ED Provider Notes (Signed)
History  This chart was scribed for Ward Givens, MD by Manuela Schwartz, ED scribe. This patient was seen in room APA02/APA02 and the patient's care was started at 1919.  CSN: [id number]  Arrival date & time 04/05/12  1919   First MD Initiated Contact with Patient 04/05/12 1929     Chief Complaint  Patient presents with  . Drug Overdose   Patient is a 31 y.o. female presenting with Overdose. The history is provided by the patient. No language interpreter was used.  Drug Overdose This is a new problem. The current episode started 3 to 5 hours ago. The problem occurs rarely. The problem has been gradually improving. Pertinent negatives include no chest pain and no shortness of breath. Nothing aggravates the symptoms. Nothing relieves the symptoms. She has tried nothing for the symptoms.   Jennifer Gonzales is a 31 y.o. female who presents to the Emergency Department complaining of overdose this afternoon about 4 pm after she took 10 percocet pills which was the whole bottle , 2 Topomax, 2 Lexipro, 1 birth control pill. She states hx of fibromyalgia which has gradually worsened over the past several weeks since the weather got cold and that she just wanted to take the pain away. She currently denies SI but earlier mentioned to the nurse that she was "kinda  Trying"  to kill herself. When I asked her about this, she broke out crying. She states 2 days ago her husband told her that he is going to leave her and gave her no reason for why; pt states she had no idea anything was wrong and is unsure why. She denies any previous SA or previous admission to psychiatric hospital. She states after taking pills began feeling sleepy/drowsy so she tried to go see her friend but fell down in the yard on her way out and was found by her husband. She had x3 emesis episodes in the car. She states now that she doesn't feel sleepy but does feel nauseated. States it was "a stupid maneuver".   PCP Dr Phillips Odor   Past  Medical History  Diagnosis Date  . Fibromyalgia   . Back pain     Past Surgical History  Procedure Date  . Cholecystectomy   . Back surgery   . Tonsillectomy   . Cesarean section    No family history on file.  History  Substance Use Topics  . Smoking status: Current Some Day Smoker  . Smokeless tobacco: Not on file  . Alcohol Use: No  unemployed Lives at home with husband and 2 children age 68 and [address] drugs  OB History    Grav Para Term Preterm Abortions TAB SAB Ect Mult Living                 Review of Systems  Constitutional: Negative for fever and chills.  Respiratory: Negative for shortness of breath.   Cardiovascular: Negative for chest pain.  Gastrointestinal: Positive for nausea. Negative for vomiting.  Neurological: Negative for weakness.  Psychiatric/Behavioral: Positive for self-injury.  All other systems reviewed and are negative.    Allergies  Ketorolac tromethamine  Home Medications   Current Outpatient Rx  Name  Route  Sig  Dispense  Refill  . ESCITALOPRAM OXALATE 10 MG PO TABS   Oral   Take 20 mg by mouth daily.          . NORETHINDRONE 0.35 MG PO TABS   Oral   Take 1 tablet by  mouth daily.         . OXYCODONE-ACETAMINOPHEN 5-325 MG PO TABS   Oral   Take 1 tablet by mouth every 4 (four) hours as needed for pain.   10 tablet   0   . TOPIRAMATE 50 MG PO TABS   Oral   Take 50 mg by mouth daily.          . CYCLOBENZAPRINE HCL 10 MG PO TABS   Oral   Take 1 tablet (10 mg total) by mouth 3 (three) times daily as needed for muscle spasms.   30 tablet   0     BP 101/61  Pulse 88  Temp 97.6 F (36.4 C) (Oral)  Resp 20  SpO2 97%  LMP 02/25/2012  Physical Exam  Nursing note and vitals reviewed. Constitutional: She is oriented to person, place, and time. She appears well-developed and well-nourished.  Non-toxic appearance. She does not appear ill. No distress.  HENT:  Head: Normocephalic and atraumatic.  Right  Ear: External ear normal.  Left Ear: External ear normal.  Nose: Nose normal. No mucosal edema or rhinorrhea.  Mouth/Throat: Oropharynx is clear and moist and mucous membranes are normal. No dental abscesses or uvula swelling.  Eyes: Conjunctivae normal and EOM are normal. Pupils are equal, round, and reactive to light.  Neck: Normal range of motion and full passive range of motion without pain. Neck supple.  Cardiovascular: Regular rhythm and normal heart sounds.  Exam reveals no gallop and no friction rub.   No murmur heard.      Tachycardic  Pulmonary/Chest: Effort normal and breath sounds normal. No respiratory distress. She has no wheezes. She has no rhonchi. She has no rales. She exhibits no tenderness and no crepitus.  Abdominal: Soft. Normal appearance and bowel sounds are normal. She exhibits no distension. There is no tenderness. There is no rebound and no guarding.  Musculoskeletal: Normal range of motion. She exhibits no edema and no tenderness.       Moves all extremities well.   Neurological: She is alert and oriented to person, place, and time. She has normal strength. No cranial nerve deficit.  Skin: Skin is warm, dry and intact. No rash noted. No erythema. No pallor.  Psychiatric: She has a normal mood and affect. Her speech is normal and behavior is normal. Her mood appears not anxious.    ED Course  Procedures (including critical care time)  Medications  cyclobenzaprine (FLEXERIL) 10 MG tablet (not administered)  ondansetron (ZOFRAN) injection 4 mg (4 mg Intravenous Given 04/05/12 2023)   DIAGNOSTIC STUDIES: Oxygen Saturation is 96% on room air, adeuate by my interpretation.    COORDINATION OF CARE: At 745 PM Discussed treatment plan with patient which includes zofran, acetaminophen level, blood work UD. Patient agrees.   21:17 Poison Control, watch for 6 hrs post ingestion, suggests EKG if case she took more of lexapro than stated, can cause QT problems, lexapro  over 300 mg would require admission to obs for 24 hrs,  22:13 Dr Jacky Kindle, telepsych called to get information before consult  23:30 Dr Jacky Kindle recommends discharge to get outpatient mental health evaluation.   23:50 pt alert, awake, nausea gone. Agrees she won't do this again because she has 2 children. We also discussed accidentally overdosing by not following her prescription instructions.   Results for orders placed during the hospital encounter of 04/05/12  ACETAMINOPHEN LEVEL      Component Value Range   Acetaminophen (Tylenol), Serum 38.5 (*)  10 - 30 ug/mL  CBC      Component Value Range   WBC 15.3 (*) 4.0 - 10.5 K/uL   RBC 4.49  3.87 - 5.11 MIL/uL   Hemoglobin 14.4  12.0 - 15.0 g/dL   HCT 16.1  09.6 - 04.5 %   MCV 92.0  78.0 - 100.0 fL   MCH 32.1  26.0 - 34.0 pg   MCHC 34.9  30.0 - 36.0 g/dL   RDW 40.9  81.1 - 91.4 %   Platelets 211  150 - 400 K/uL  COMPREHENSIVE METABOLIC PANEL      Component Value Range   Sodium 134 (*) 135 - 145 mEq/L   Potassium 3.2 (*) 3.5 - 5.1 mEq/L   Chloride 98  96 - 112 mEq/L   CO2 21  19 - 32 mEq/L   Glucose, Bld 159 (*) 70 - 99 mg/dL   BUN 10  6 - 23 mg/dL   Creatinine, Ser 7.82  0.50 - 1.10 mg/dL   Calcium 9.3  8.4 - 95.6 mg/dL   Total Protein 7.6  6.0 - 8.3 g/dL   Albumin 4.4  3.5 - 5.2 g/dL   AST 15  0 - 37 U/L   ALT 12  0 - 35 U/L   Alkaline Phosphatase 65  39 - 117 U/L   Total Bilirubin 0.5  0.3 - 1.2 mg/dL   GFR calc non Af Amer >90  >90 mL/min   GFR calc Af Amer >90  >90 mL/min  ETHANOL      Component Value Range   Alcohol, Ethyl (B) <11  0 - 11 mg/dL  SALICYLATE LEVEL      Component Value Range   Salicylate Lvl <2.0 (*) 2.8 - 20.0 mg/dL  URINE RAPID DRUG SCREEN (HOSP PERFORMED)      Component Value Range   Opiates NONE DETECTED  NONE DETECTED   Cocaine NONE DETECTED  NONE DETECTED   Benzodiazepines NONE DETECTED  NONE DETECTED   Amphetamines NONE DETECTED  NONE DETECTED   Tetrahydrocannabinol NONE DETECTED  NONE  DETECTED   Barbiturates NONE DETECTED  NONE DETECTED  POCT PREGNANCY, URINE      Component Value Range   Preg Test, Ur NEGATIVE  NEGATIVE   Laboratory interpretation all normal except leukocytosis, elevated acetaminophen level, hypokalemia, hyponatremia    Date: 04/05/2012  Rate: 91  Rhythm: normal sinus rhythm  QRS Axis: normal  Intervals: normal  ST/T Wave abnormalities: normal  Conduction Disutrbances:none  Narrative Interpretation:   Old EKG Reviewed: none available    1. Depression   2. Overdose     New Prescriptions   CYCLOBENZAPRINE (FLEXERIL) 10 MG TABLET    Take 1 tablet (10 mg total) by mouth 3 (three) times daily as needed for muscle spasms.    Plan discharge  Devoria Albe, MD, FACEP    MDM  I personally performed the services described in this documentation, which was scribed in my presence. The recorded information has been reviewed and considered.  Devoria Albe, MD, Armando Gang          Ward Givens, MD 04/06/12 [id number]

## 2012-04-05 NOTE — ED Notes (Addendum)
Per spouse, pt took 10 Percocet tablets (unknown dose -spouse has gone home to get meds) and an unknown amount of Topamax. Pt is drowsy, but awake and answering questions.  States she does not know what time or how much of each medication she took. States she was "kind of" trying to kill herself; states her husband just asked her for a divorce.

## 2012-04-06 MED ORDER — CYCLOBENZAPRINE HCL 10 MG PO TABS: 10.0000 mg | ORAL_TABLET | Freq: Three times a day (TID) | ORAL | Status: DC | PRN

## 2012-07-28 ENCOUNTER — Encounter: Payer: Self-pay | Admitting: Adult Health

## 2012-07-28 DIAGNOSIS — G43909 Migraine, unspecified, not intractable, without status migrainosus: Secondary | ICD-10-CM | POA: Insufficient documentation

## 2012-07-28 DIAGNOSIS — F329 Major depressive disorder, single episode, unspecified: Secondary | ICD-10-CM

## 2012-07-28 DIAGNOSIS — M797 Fibromyalgia: Secondary | ICD-10-CM | POA: Insufficient documentation

## 2012-07-28 DIAGNOSIS — Z8669 Personal history of other diseases of the nervous system and sense organs: Secondary | ICD-10-CM | POA: Insufficient documentation

## 2012-07-28 DIAGNOSIS — B9689 Other specified bacterial agents as the cause of diseases classified elsewhere: Secondary | ICD-10-CM | POA: Insufficient documentation

## 2012-07-28 DIAGNOSIS — N760 Acute vaginitis: Secondary | ICD-10-CM

## 2012-07-28 DIAGNOSIS — M549 Dorsalgia, unspecified: Secondary | ICD-10-CM | POA: Insufficient documentation

## 2012-07-31 ENCOUNTER — Ambulatory Visit (INDEPENDENT_AMBULATORY_CARE_PROVIDER_SITE_OTHER): Payer: 59 | Admitting: Adult Health

## 2012-07-31 ENCOUNTER — Encounter: Payer: Self-pay | Admitting: Adult Health

## 2012-07-31 VITALS — BP 112/70 | Resp 20 | Ht 59.0 in | Wt 195.0 lb

## 2012-07-31 DIAGNOSIS — Z3049 Encounter for surveillance of other contraceptives: Secondary | ICD-10-CM

## 2012-07-31 MED ORDER — MEDROXYPROGESTERONE ACETATE 150 MG/ML IM SUSP: 150.0000 mg | INTRAMUSCULAR | Status: DC

## 2012-07-31 NOTE — Progress Notes (Signed)
Subjective:     Patient ID: Jennifer Gonzales, female   DOB: 1981-05-03, 32 y.o.   MRN: [id number]  HPI32 year old white female in today wanting to start Depo provera injection. Not having sex currenting.   Review of Systems No complaints    Objective:   Physical Exam  Constitutional: She appears well-developed and well-nourished.  Cardiovascular: Normal rate and regular rhythm.   Pulmonary/Chest: Breath sounds normal.  Skin: Skin is warm and dry.       Assessment:     Contraceptive Management    Plan:     Get depo provera from [facility] and call with [facility] to make appointment for injection.

## 2012-07-31 NOTE — Patient Instructions (Signed)
Call with [facility] for injection

## 2012-08-20 ENCOUNTER — Ambulatory Visit (INDEPENDENT_AMBULATORY_CARE_PROVIDER_SITE_OTHER): Payer: Private Health Insurance - Indemnity | Admitting: Obstetrics & Gynecology

## 2012-08-20 ENCOUNTER — Encounter: Payer: Self-pay | Admitting: Obstetrics & Gynecology

## 2012-08-20 VITALS — BP 112/60 | Ht 59.5 in | Wt 198.0 lb

## 2012-08-20 DIAGNOSIS — Z3202 Encounter for pregnancy test, result negative: Secondary | ICD-10-CM

## 2012-08-20 DIAGNOSIS — Z309 Encounter for contraceptive management, unspecified: Secondary | ICD-10-CM

## 2012-08-20 DIAGNOSIS — Z3049 Encounter for surveillance of other contraceptives: Secondary | ICD-10-CM

## 2012-08-20 LAB — POCT URINE PREGNANCY: Preg Test, Ur: NEGATIVE

## 2012-08-20 MED ORDER — HYDROXYPROGESTERONE CAPROATE 250 MG/ML IM OIL: 250.0000 mg | TOPICAL_OIL | Freq: Once | INTRAMUSCULAR | Status: DC

## 2012-08-20 MED ORDER — MEDROXYPROGESTERONE ACETATE 150 MG/ML IM SUSP
150.0000 mg | Freq: Once | INTRAMUSCULAR | Status: AC
  Administered 2012-08-20: 150 mg via INTRAMUSCULAR

## 2012-11-08 ENCOUNTER — Other Ambulatory Visit: Payer: Self-pay | Admitting: Adult Health

## 2012-11-13 ENCOUNTER — Encounter: Payer: Self-pay | Admitting: *Deleted

## 2012-11-13 ENCOUNTER — Ambulatory Visit: Payer: Private Health Insurance - Indemnity

## 2012-11-14 ENCOUNTER — Ambulatory Visit (INDEPENDENT_AMBULATORY_CARE_PROVIDER_SITE_OTHER): Payer: Private Health Insurance - Indemnity | Admitting: Obstetrics & Gynecology

## 2012-11-14 ENCOUNTER — Encounter: Payer: Self-pay | Admitting: Obstetrics & Gynecology

## 2012-11-14 VITALS — BP 118/68 | Ht 59.5 in | Wt 191.0 lb

## 2012-11-14 DIAGNOSIS — Z3202 Encounter for pregnancy test, result negative: Secondary | ICD-10-CM

## 2012-11-14 DIAGNOSIS — Z309 Encounter for contraceptive management, unspecified: Secondary | ICD-10-CM

## 2012-11-14 DIAGNOSIS — Z3049 Encounter for surveillance of other contraceptives: Secondary | ICD-10-CM

## 2012-11-14 DIAGNOSIS — Z3200 Encounter for pregnancy test, result unknown: Secondary | ICD-10-CM

## 2012-11-14 MED ORDER — MEDROXYPROGESTERONE ACETATE 150 MG/ML IM SUSP
150.0000 mg | Freq: Once | INTRAMUSCULAR | Status: AC
  Administered 2012-11-14: 150 mg via INTRAMUSCULAR

## 2012-11-27 ENCOUNTER — Telehealth: Payer: Self-pay | Admitting: Adult Health

## 2012-11-27 MED ORDER — ESCITALOPRAM OXALATE 20 MG PO TABS: ORAL_TABLET | ORAL | Status: DC

## 2012-11-27 NOTE — Telephone Encounter (Signed)
[facility]

## 2012-11-27 NOTE — Addendum Note (Signed)
Addended by: Cyril Mourning A on: 11/27/2012 02:01 PM   Modules accepted: Orders

## 2012-11-27 NOTE — Telephone Encounter (Signed)
Pt states contacted pharmacy in regards to a refill request on Lexapro (escitalopram) and was told she needed to contact Cyril Mourning, NP concerning medication refill.  Please advise.

## 2012-11-27 NOTE — Telephone Encounter (Signed)
Left message to call back  

## 2013-02-06 ENCOUNTER — Encounter: Payer: Self-pay | Admitting: Adult Health

## 2013-02-06 ENCOUNTER — Ambulatory Visit (INDEPENDENT_AMBULATORY_CARE_PROVIDER_SITE_OTHER): Payer: Private Health Insurance - Indemnity | Admitting: Adult Health

## 2013-02-06 VITALS — BP 116/80 | Ht 59.0 in | Wt 192.0 lb

## 2013-02-06 DIAGNOSIS — Z3049 Encounter for surveillance of other contraceptives: Secondary | ICD-10-CM

## 2013-02-06 DIAGNOSIS — Z3202 Encounter for pregnancy test, result negative: Secondary | ICD-10-CM

## 2013-02-06 DIAGNOSIS — Z309 Encounter for contraceptive management, unspecified: Secondary | ICD-10-CM

## 2013-02-06 MED ORDER — MEDROXYPROGESTERONE ACETATE 150 MG/ML IM SUSP
150.0000 mg | Freq: Once | INTRAMUSCULAR | Status: AC
  Administered 2013-02-06: 150 mg via INTRAMUSCULAR

## 2013-05-01 ENCOUNTER — Ambulatory Visit (INDEPENDENT_AMBULATORY_CARE_PROVIDER_SITE_OTHER): Payer: Private Health Insurance - Indemnity | Admitting: Adult Health

## 2013-05-01 ENCOUNTER — Encounter (INDEPENDENT_AMBULATORY_CARE_PROVIDER_SITE_OTHER): Payer: Self-pay

## 2013-05-01 ENCOUNTER — Encounter: Payer: Self-pay | Admitting: Adult Health

## 2013-05-01 VITALS — BP 126/84 | Ht 59.0 in | Wt 185.0 lb

## 2013-05-01 DIAGNOSIS — Z309 Encounter for contraceptive management, unspecified: Secondary | ICD-10-CM

## 2013-05-01 DIAGNOSIS — Z3049 Encounter for surveillance of other contraceptives: Secondary | ICD-10-CM

## 2013-05-01 DIAGNOSIS — Z3202 Encounter for pregnancy test, result negative: Secondary | ICD-10-CM

## 2013-05-01 LAB — POCT URINE PREGNANCY: Preg Test, Ur: NEGATIVE

## 2013-05-01 MED ORDER — MEDROXYPROGESTERONE ACETATE 150 MG/ML IM SUSP
150.0000 mg | Freq: Once | INTRAMUSCULAR | Status: AC
  Administered 2013-05-01: 150 mg via INTRAMUSCULAR

## 2013-05-01 NOTE — Addendum Note (Signed)
Addended by: Criss Alvine on: 05/01/2013 04:43 PM   Modules accepted: Orders, Level of Service

## 2013-05-01 NOTE — Progress Notes (Signed)
Patient ID: Jennifer Gonzales, female   DOB: 11-26-1980, 32 y.o.   MRN: [id number] Depo provera 150 mg given in right deltoid, no complications, pregnancy test resulted negative. Pt to return in 12 weeks for next injection.

## 2013-07-24 ENCOUNTER — Ambulatory Visit: Payer: Private Health Insurance - Indemnity

## 2013-07-26 ENCOUNTER — Ambulatory Visit (INDEPENDENT_AMBULATORY_CARE_PROVIDER_SITE_OTHER): Payer: 59 | Admitting: Adult Health

## 2013-07-26 ENCOUNTER — Encounter: Payer: Self-pay | Admitting: Adult Health

## 2013-07-26 ENCOUNTER — Encounter (INDEPENDENT_AMBULATORY_CARE_PROVIDER_SITE_OTHER): Payer: Self-pay

## 2013-07-26 VITALS — BP 120/76 | Ht 59.0 in | Wt 183.0 lb

## 2013-07-26 DIAGNOSIS — Z3202 Encounter for pregnancy test, result negative: Secondary | ICD-10-CM

## 2013-07-26 DIAGNOSIS — Z309 Encounter for contraceptive management, unspecified: Secondary | ICD-10-CM

## 2013-07-26 DIAGNOSIS — Z3200 Encounter for pregnancy test, result unknown: Secondary | ICD-10-CM

## 2013-07-26 DIAGNOSIS — Z3049 Encounter for surveillance of other contraceptives: Secondary | ICD-10-CM

## 2013-07-26 LAB — POCT URINE PREGNANCY: Preg Test, Ur: NEGATIVE

## 2013-07-26 MED ORDER — MEDROXYPROGESTERONE ACETATE 150 MG/ML IM SUSP
150.0000 mg | Freq: Once | INTRAMUSCULAR | Status: AC
  Administered 2013-07-26: 150 mg via INTRAMUSCULAR

## 2013-09-16 ENCOUNTER — Other Ambulatory Visit: Payer: Self-pay | Admitting: Adult Health

## 2013-10-17 ENCOUNTER — Ambulatory Visit (INDEPENDENT_AMBULATORY_CARE_PROVIDER_SITE_OTHER): Payer: 59 | Admitting: Obstetrics & Gynecology

## 2013-10-17 ENCOUNTER — Encounter: Payer: Self-pay | Admitting: Obstetrics & Gynecology

## 2013-10-17 VITALS — BP 120/76 | Ht 59.0 in | Wt 182.0 lb

## 2013-10-17 DIAGNOSIS — Z3049 Encounter for surveillance of other contraceptives: Secondary | ICD-10-CM

## 2013-10-17 DIAGNOSIS — Z309 Encounter for contraceptive management, unspecified: Secondary | ICD-10-CM

## 2013-10-17 DIAGNOSIS — Z3202 Encounter for pregnancy test, result negative: Secondary | ICD-10-CM

## 2013-10-17 LAB — POCT URINE PREGNANCY: Preg Test, Ur: NEGATIVE

## 2013-10-17 MED ORDER — MEDROXYPROGESTERONE ACETATE 150 MG/ML IM SUSP
150.0000 mg | Freq: Once | INTRAMUSCULAR | Status: AC
  Administered 2013-10-17: 150 mg via INTRAMUSCULAR

## 2013-10-17 NOTE — Progress Notes (Signed)
Pt here for Depo shot. No complaints at this time. To return in 12 weeks for next shot. JSY 

## 2014-01-09 ENCOUNTER — Ambulatory Visit: Payer: 59

## 2014-02-11 ENCOUNTER — Emergency Department (HOSPITAL_COMMUNITY)
Admission: EM | Admit: 2014-02-11 | Discharge: 2014-02-11 | Disposition: A | Discharge status: Home / Self Care | Payer: Private Health Insurance - Indemnity | Attending: Emergency Medicine | Admitting: Emergency Medicine

## 2014-02-11 ENCOUNTER — Encounter (HOSPITAL_COMMUNITY): Payer: Self-pay | Admitting: Emergency Medicine

## 2014-02-11 DIAGNOSIS — Y9289 Other specified places as the place of occurrence of the external cause: Secondary | ICD-10-CM | POA: Insufficient documentation

## 2014-02-11 DIAGNOSIS — H9202 Otalgia, left ear: Secondary | ICD-10-CM

## 2014-02-11 DIAGNOSIS — IMO0002 Reserved for concepts with insufficient information to code with codable children: Secondary | ICD-10-CM | POA: Insufficient documentation

## 2014-02-11 DIAGNOSIS — Z87440 Personal history of urinary (tract) infections: Secondary | ICD-10-CM | POA: Insufficient documentation

## 2014-02-11 DIAGNOSIS — F17200 Nicotine dependence, unspecified, uncomplicated: Secondary | ICD-10-CM | POA: Insufficient documentation

## 2014-02-11 DIAGNOSIS — Z79899 Other long term (current) drug therapy: Secondary | ICD-10-CM | POA: Insufficient documentation

## 2014-02-11 DIAGNOSIS — G43909 Migraine, unspecified, not intractable, without status migrainosus: Secondary | ICD-10-CM | POA: Insufficient documentation

## 2014-02-11 DIAGNOSIS — T169XXA Foreign body in ear, unspecified ear, initial encounter: Secondary | ICD-10-CM | POA: Insufficient documentation

## 2014-02-11 DIAGNOSIS — Z8739 Personal history of other diseases of the musculoskeletal system and connective tissue: Secondary | ICD-10-CM | POA: Insufficient documentation

## 2014-02-11 DIAGNOSIS — Y9389 Activity, other specified: Secondary | ICD-10-CM | POA: Insufficient documentation

## 2014-02-11 MED ORDER — ANTIPYRINE-BENZOCAINE 5.4-1.4 % OT SOLN
3.0000 [drp] | Freq: Once | OTIC | Status: AC
  Administered 2014-02-11: 3 [drp] via OTIC
  Filled 2014-02-11: qty 10

## 2014-02-11 NOTE — Discharge Instructions (Signed)
Ear Drops °You need to put eardrops in your ear. °HOME CARE  °· Put drops in your affected ear as told. °· After putting in the drops, lie down with the ear you put the drops in facing up. Stay this way for 10 minutes. Use the ear drops as long as your doctor tells you. °· Before you get up, put a cotton ball gently in your ear. Do not push it far in your ear. °· Do not wash out your ears unless your doctor says it is okay. °· Finish all medicines as told by your doctor. You may be told to keep using the eardrops even if you start to feel better. °· See your doctor as told for follow-up visits. °GET HELP IF: °· You have pain that gets worse. °· Any unusual fluid (drainage) is coming from your ear (especially if the fluid stinks). °· You have trouble hearing. °· You get really dizzy as if the room is spinning and feel sick to your stomach (vertigo). °· The outside of your ear becomes red or puffy or both. This may be a sign of an allergic reaction. °MAKE SURE YOU:  °· Understand these instructions. °· Will watch your condition. °· Will get help right away if you are not doing well or get worse. °Document Released: 10/20/2009 Document Revised: 05/07/2013 Document Reviewed: 11/27/2012 °ExitCare® Patient Information ©2015 [employer]. This information is not intended to replace advice given to you by your health care provider. Make sure you discuss any questions you have with your health care provider. ° °

## 2014-02-11 NOTE — ED Notes (Signed)
PT states parts of an earring fell into her left ear and now is causes pain.

## 2014-02-13 NOTE — ED Provider Notes (Signed)
CSN: [id number]     Arrival date & time 02/11/14  1733 History   First MD Initiated Contact with Patient 02/11/14 1830     Chief Complaint  Patient presents with  . Foreign Body in Ear     (Consider location/radiation/quality/duration/timing/severity/associated sxs/prior Treatment) HPI  Jennifer Gonzales is a 33 y.o. female who presents to the Emergency Department complaining of foreign body to the left ear.  She states that the backing from an earring fell into her left ear and a family member tried to remove it, but she believes that it is lodged in her ear canal.  She denies loss of hearing, dizziness bleeding of the ear or swelling.     Past Medical History  Diagnosis Date  . Fibromyalgia   . Back pain   . Mental disorder   . Hx of migraines   . BV (bacterial vaginosis)    Past Surgical History  Procedure Laterality Date  . Cholecystectomy    . Back surgery    . Tonsillectomy    . Cesarean section     Family History  Problem Relation Age of Onset  . Hypertension Other   . Diabetes Other   . Stroke Other   . Heart disease Other   . Cancer Paternal Grandmother     breast   History  Substance Use Topics  . Smoking status: Heavy Tobacco Smoker -- 0.50 packs/day    Types: Cigarettes  . Smokeless tobacco: Never Used  . Alcohol Use: No   OB History   Grav Para Term Preterm Abortions TAB SAB Ect Mult Living   2 2        2      Review of Systems  Constitutional: Negative for fever.  HENT: Positive for ear pain. Negative for congestion, ear discharge, facial swelling, hearing loss and trouble swallowing.   Skin: Negative for wound.  All other systems reviewed and are negative.     Allergies  Ketorolac tromethamine  Home Medications   Prior to Admission medications   Medication Sig Start Date End Date Taking? Authorizing Provider  cetirizine (ZYRTEC) 10 MG tablet Take 10 mg by mouth daily.   Yes Historical Provider, MD  cyclobenzaprine (FLEXERIL) 10 MG tablet  Take 1 tablet (10 mg total) by mouth 3 (three) times daily as needed for muscle spasms. 04/06/12  Yes Ward Givens, MD  escitalopram (LEXAPRO) 20 MG tablet Take 20 mg by mouth daily. 11/27/12  Yes Adline Potter, NP  ibuprofen (ADVIL,MOTRIN) 200 MG tablet Take 800 mg by mouth daily as needed for mild pain or moderate pain.    Yes Historical Provider, MD  medroxyPROGESTERone (DEPO-PROVERA) 150 MG/ML injection use as directed at physician's office every 3 months 09/16/13  Yes Adline Potter, NP  topiramate (TOPAMAX) 50 MG tablet Take 50 mg by mouth daily.    Yes Historical Provider, MD   BP 127/81  Pulse 85  Temp(Src) 98.7 F (37.1 C) (Oral)  Resp 14  Ht 4\' 11"  (1.499 m)  Wt 165 lb (74.844 kg)  BMI 33.31 kg/m2  SpO2 99% Physical Exam  Nursing note and vitals reviewed. Constitutional: She is oriented to person, place, and time. She appears well-developed and well-nourished. No distress.  HENT:  Head: Normocephalic and atraumatic.  Right Ear: Tympanic membrane, external ear and ear canal normal.  Left Ear: Tympanic membrane, external ear and ear canal normal.  Mouth/Throat: Oropharynx is clear and moist.  Small abrasion to the left ear canal.  No bleeding or edema, no foreign body seen.  TM intact and appears nml  Neck: Normal range of motion. Neck supple.  Neurological: She is alert and oriented to person, place, and time. She exhibits normal muscle tone. Coordination normal.  Skin: Skin is warm and dry.  Psychiatric: She has a normal mood and affect.    ED Course  Procedures (including critical care time) Labs Review Labs Reviewed - No data to display  Imaging Review No results found.   EKG Interpretation None      MDM   Final diagnoses:  Otalgia of left ear    Pt is well appearing.  No FB or abnormality of the left ear or ear canal.  Auralgan otic drops dispensed.  Pt stable for d/c    Aadam Zhen L. Trisha Mangleriplett, PA-C 02/13/14 1806

## 2014-02-24 NOTE — ED Provider Notes (Signed)
Medical screening examination/treatment/procedure(s) were performed by non-physician practitioner and as supervising physician I was immediately available for consultation/collaboration.   EKG Interpretation None        Vanetta MuldersScott Sevanna Ballengee, MD 02/24/14 2155

## 2014-03-17 ENCOUNTER — Encounter (HOSPITAL_COMMUNITY): Payer: Self-pay | Admitting: Emergency Medicine

## 2014-04-30 ENCOUNTER — Emergency Department (HOSPITAL_COMMUNITY)
Admission: EM | Admit: 2014-04-30 | Discharge: 2014-04-30 | Disposition: A | Discharge status: Home / Self Care | Payer: Self-pay | Attending: Emergency Medicine | Admitting: Emergency Medicine

## 2014-04-30 ENCOUNTER — Encounter (HOSPITAL_COMMUNITY): Payer: Self-pay | Admitting: Emergency Medicine

## 2014-04-30 ENCOUNTER — Emergency Department (HOSPITAL_COMMUNITY): Payer: Self-pay

## 2014-04-30 DIAGNOSIS — Z79899 Other long term (current) drug therapy: Secondary | ICD-10-CM | POA: Insufficient documentation

## 2014-04-30 DIAGNOSIS — R52 Pain, unspecified: Secondary | ICD-10-CM

## 2014-04-30 DIAGNOSIS — Y9389 Activity, other specified: Secondary | ICD-10-CM | POA: Insufficient documentation

## 2014-04-30 DIAGNOSIS — Z8659 Personal history of other mental and behavioral disorders: Secondary | ICD-10-CM | POA: Insufficient documentation

## 2014-04-30 DIAGNOSIS — Y9289 Other specified places as the place of occurrence of the external cause: Secondary | ICD-10-CM | POA: Insufficient documentation

## 2014-04-30 DIAGNOSIS — X58XXXA Exposure to other specified factors, initial encounter: Secondary | ICD-10-CM | POA: Insufficient documentation

## 2014-04-30 DIAGNOSIS — S93401A Sprain of unspecified ligament of right ankle, initial encounter: Secondary | ICD-10-CM | POA: Insufficient documentation

## 2014-04-30 DIAGNOSIS — Z87440 Personal history of urinary (tract) infections: Secondary | ICD-10-CM | POA: Insufficient documentation

## 2014-04-30 DIAGNOSIS — G43909 Migraine, unspecified, not intractable, without status migrainosus: Secondary | ICD-10-CM | POA: Insufficient documentation

## 2014-04-30 DIAGNOSIS — Y998 Other external cause status: Secondary | ICD-10-CM | POA: Insufficient documentation

## 2014-04-30 NOTE — ED Notes (Signed)
Pt reports twisting right ankle after slipping.  Mild swelling noted.  Ice applied.

## 2014-04-30 NOTE — Discharge Instructions (Signed)
Ankle Sprain  An ankle sprain is an injury to the strong, fibrous tissues (ligaments) that hold your ankle bones together.   HOME CARE   · Put ice on your ankle for 1-2 days or as told by your doctor.  ¨ Put ice in a plastic bag.  ¨ Place a towel between your skin and the bag.  ¨ Leave the ice on for 15-20 minutes at a time, every 2 hours while you are awake.  · Only take medicine as told by your doctor.  · Raise (elevate) your injured ankle above the level of your heart as much as possible for 2-3 days.  · Use crutches if your doctor tells you to. Slowly put your own weight on the affected ankle. Use the crutches until you can walk without pain.  · If you have a plaster splint:  ¨ Do not rest it on anything harder than a pillow for 24 hours.  ¨ Do not put weight on it.  ¨ Do not get it wet.  ¨ Take it off to shower or bathe.  · If given, use an elastic wrap or support stocking for support. Take the wrap off if your toes lose feeling (numb), tingle, or turn cold or blue.  · If you have an air splint:  ¨ Add or let out air to make it comfortable.  ¨ Take it off at night and to shower and bathe.  ¨ Wiggle your toes and move your ankle up and down often while you are wearing it.  GET HELP IF:  · You have rapidly increasing bruising or puffiness (swelling).  · Your toes feel very cold.  · You lose feeling in your foot.  · Your medicine does not help your pain.  GET HELP RIGHT AWAY IF:   · Your toes lose feeling (numb) or turn blue.  · You have severe pain that is increasing.  MAKE SURE YOU:   · Understand these instructions.  · Will watch your condition.  · Will get help right away if you are not doing well or get worse.  Document Released: 10/19/2007 Document Revised: 09/16/2013 Document Reviewed: 11/14/2011  ExitCare® Patient Information ©2015 [employer]. This information is not intended to replace advice given to you by your health care provider. Make sure you discuss any questions you have with your health care  provider.

## 2014-04-30 NOTE — ED Notes (Signed)
Pt stated she slipped on some pine needles this morning. Was walking around house this evening and ankle was hurting so she took off her shoe and it "started swelling immediately".

## 2014-04-30 NOTE — ED Provider Notes (Signed)
CSN: [id number]     Arrival date & time 04/30/14  2217 History   First MD Initiated Contact with Patient 04/30/14 2306     Chief Complaint  Patient presents with  . Foot Pain     (Consider location/radiation/quality/duration/timing/severity/associated sxs/prior Treatment) HPI  Jennifer Gonzales is a 33 y.o. female who presents to the Emergency Department complaining of right ankle pain after a twisting injury that occurred earlier today.  She states that she slipped on some pine needles and her "ankle rolled."  She reports increasing pain and swelling of the ankle throughout the evening.  She has taken ibuprofen earlier with moderate relief.  She denies other injuries, numbness or weakness of the leg, or open wounds.  Pain is worse with weight bearing and improves at rest.     Past Medical History  Diagnosis Date  . Fibromyalgia   . Back pain   . Mental disorder   . Hx of migraines   . BV (bacterial vaginosis)    Past Surgical History  Procedure Laterality Date  . Cholecystectomy    . Back surgery    . Tonsillectomy    . Cesarean section     Family History  Problem Relation Age of Onset  . Hypertension Other   . Diabetes Other   . Stroke Other   . Heart disease Other   . Cancer Paternal Grandmother     breast   History  Substance Use Topics  . Smoking status: Heavy Tobacco Smoker -- 0.50 packs/day    Types: Cigarettes  . Smokeless tobacco: Never Used  . Alcohol Use: No   OB History    Gravida Para Term Preterm AB TAB SAB Ectopic Multiple Living   2 2        2      Review of Systems  Constitutional: Negative for fever and chills.  Genitourinary: Negative for dysuria and difficulty urinating.  Musculoskeletal: Positive for joint swelling and arthralgias (right ankle pain).  Skin: Negative for color change and wound.  Neurological: Negative for syncope, weakness, numbness and headaches.  All other systems reviewed and are negative.     Allergies  Ketorolac  tromethamine  Home Medications   Prior to Admission medications   Medication Sig Start Date End Date Taking? Authorizing Provider  cetirizine (ZYRTEC) 10 MG tablet Take 10 mg by mouth daily.    Historical Provider, MD  cyclobenzaprine (FLEXERIL) 10 MG tablet Take 1 tablet (10 mg total) by mouth 3 (three) times daily as needed for muscle spasms. 04/06/12   Ward GivensIva L Knapp, MD  escitalopram (LEXAPRO) 20 MG tablet Take 20 mg by mouth daily. 11/27/12   Adline PotterJennifer A Griffin, NP  ibuprofen (ADVIL,MOTRIN) 200 MG tablet Take 800 mg by mouth daily as needed for mild pain or moderate pain.     Historical Provider, MD  medroxyPROGESTERone (DEPO-PROVERA) 150 MG/ML injection use as directed at physician's office every 3 months 09/16/13   Adline PotterJennifer A Griffin, NP  topiramate (TOPAMAX) 50 MG tablet Take 50 mg by mouth daily.     Historical Provider, MD   BP 122/80 mmHg  Pulse 86  Temp(Src) 98.9 F (37.2 C) (Oral)  Resp 20  Ht 4\' 11"  (1.499 m)  Wt 155 lb (70.308 kg)  BMI 31.29 kg/m2  SpO2 100%  LMP 11/14/1998 (Approximate) Physical Exam  Constitutional: She is oriented to person, place, and time. She appears well-developed and well-nourished. No distress.  HENT:  Head: Normocephalic and atraumatic.  Cardiovascular: Normal rate, regular  rhythm, normal heart sounds and intact distal pulses.   Pulmonary/Chest: Effort normal and breath sounds normal. No respiratory distress.  Musculoskeletal: She exhibits edema and tenderness.  Localized tenderness at the lateral right ankle, mild STS is present.  ROM is preserved.  DP pulse is brisk,distal sensation intact.  No erythema, abrasion, bruising or bony deformity.  No proximal tenderness. Compartments soft  Neurological: She is alert and oriented to person, place, and time. She exhibits normal muscle tone. Coordination normal.  Skin: Skin is warm and dry.  Nursing note and vitals reviewed.   ED Course  Procedures (including critical care time) Labs Review Labs  Reviewed - No data to display  Imaging Review Dg Ankle Complete Right  04/30/2014   CLINICAL DATA:  Fall, slipped on pine needles this morning right foot pain ankle pain  EXAM: RIGHT ANKLE - COMPLETE 3+ VIEW  COMPARISON:  None.  FINDINGS: Three views of the right ankle submitted. No acute fracture or subluxation. Ankle mortise is preserved. Tiny bony fragment adjacent to medial malleolus is probable from prior injury as is well corticated.  IMPRESSION: Negative.   Electronically Signed   By: Natasha MeadLiviu  Pop M.D.   On: 04/30/2014 22:52   Dg Foot Complete Right  04/30/2014   CLINICAL DATA:  Pain, fall this morning slipped on pine needles  EXAM: RIGHT FOOT COMPLETE - 3+ VIEW  COMPARISON:  None.  FINDINGS: Three views of the right foot submitted. No acute fracture or subluxation. No radiopaque foreign body.  IMPRESSION: Negative.   Electronically Signed   By: Natasha MeadLiviu  Pop M.D.   On: 04/30/2014 22:54     EKG Interpretation None      MDM   Final diagnoses:  Ankle sprain, right, initial encounter   Pt with likely sprain of the right ankle.  NV intact.  She agrees to ibuprofen, elevate, ice.  ASO applied, pain improved, pt also given crutches.      Paula Busenbark L. Trisha Mangleriplett, PA-C 04/30/14 2334  Hanley SeamenJohn L Molpus, MD 05/01/14 [phone]

## 2014-08-03 ENCOUNTER — Emergency Department (HOSPITAL_COMMUNITY): Payer: Self-pay

## 2014-08-03 ENCOUNTER — Emergency Department (HOSPITAL_COMMUNITY)
Admission: EM | Admit: 2014-08-03 | Discharge: 2014-08-04 | Disposition: A | Discharge status: Home / Self Care | Payer: Self-pay | Attending: Emergency Medicine | Admitting: Emergency Medicine

## 2014-08-03 ENCOUNTER — Encounter (HOSPITAL_COMMUNITY): Payer: Self-pay

## 2014-08-03 DIAGNOSIS — R112 Nausea with vomiting, unspecified: Secondary | ICD-10-CM | POA: Insufficient documentation

## 2014-08-03 DIAGNOSIS — Z720 Tobacco use: Secondary | ICD-10-CM | POA: Insufficient documentation

## 2014-08-03 DIAGNOSIS — Z8659 Personal history of other mental and behavioral disorders: Secondary | ICD-10-CM | POA: Insufficient documentation

## 2014-08-03 DIAGNOSIS — Z8742 Personal history of other diseases of the female genital tract: Secondary | ICD-10-CM | POA: Insufficient documentation

## 2014-08-03 DIAGNOSIS — M546 Pain in thoracic spine: Secondary | ICD-10-CM | POA: Insufficient documentation

## 2014-08-03 DIAGNOSIS — R079 Chest pain, unspecified: Secondary | ICD-10-CM | POA: Insufficient documentation

## 2014-08-03 DIAGNOSIS — Z79899 Other long term (current) drug therapy: Secondary | ICD-10-CM | POA: Insufficient documentation

## 2014-08-03 DIAGNOSIS — G43909 Migraine, unspecified, not intractable, without status migrainosus: Secondary | ICD-10-CM | POA: Insufficient documentation

## 2014-08-03 LAB — CBC
HCT: 42.7 % (ref 36.0–46.0)
Hemoglobin: 14.7 g/dL (ref 12.0–15.0)
MCH: 31.9 pg (ref 26.0–34.0)
MCHC: 34.4 g/dL (ref 30.0–36.0)
MCV: 92.6 fL (ref 78.0–100.0)
Platelets: 240 10*3/uL (ref 150–400)
RBC: 4.61 MIL/uL (ref 3.87–5.11)
RDW: 12.2 % (ref 11.5–15.5)
WBC: 10.2 10*3/uL (ref 4.0–10.5)

## 2014-08-03 LAB — BASIC METABOLIC PANEL
Anion gap: 9 (ref 5–15)
BUN: 9 mg/dL (ref 6–23)
CHLORIDE: 105 mmol/L (ref 96–112)
CO2: 20 mmol/L (ref 19–32)
Calcium: 8.8 mg/dL (ref 8.4–10.5)
Creatinine, Ser: 0.89 mg/dL (ref 0.50–1.10)
GFR, EST NON AFRICAN AMERICAN: 84 mL/min — AB (ref >90)
Glucose, Bld: 120 mg/dL — ABNORMAL HIGH (ref 70–99)
POTASSIUM: 4.1 mmol/L (ref 3.5–5.1)
SODIUM: 134 mmol/L — AB (ref 135–145)

## 2014-08-03 LAB — TROPONIN I: Troponin I: <0.03 ng/mL (ref <0.031)

## 2014-08-03 MED ORDER — HYDROMORPHONE HCL 1 MG/ML IJ SOLN
1.0000 mg | Freq: Once | INTRAMUSCULAR | Status: AC
  Administered 2014-08-04: 1 mg via INTRAVENOUS
  Filled 2014-08-03: qty 1

## 2014-08-03 MED ORDER — ONDANSETRON HCL 4 MG/2ML IJ SOLN
4.0000 mg | Freq: Once | INTRAMUSCULAR | Status: AC
  Administered 2014-08-04: 4 mg via INTRAVENOUS
  Filled 2014-08-03: qty 2

## 2014-08-03 MED ORDER — ASPIRIN 81 MG PO CHEW
324.0000 mg | CHEWABLE_TABLET | Freq: Once | ORAL | Status: AC
  Administered 2014-08-04: 324 mg via ORAL
  Filled 2014-08-03: qty 4

## 2014-08-03 MED ORDER — SODIUM CHLORIDE 0.9 % IV SOLN: INTRAVENOUS | Status: DC

## 2014-08-03 MED ORDER — SODIUM CHLORIDE 0.9 % IV BOLUS (SEPSIS)
1000.0000 mL | Freq: Once | INTRAVENOUS | Status: AC
Start: 2014-08-04 — End: 2014-08-04
  Administered 2014-08-04: 1000 mL via INTRAVENOUS

## 2014-08-03 NOTE — ED Notes (Signed)
Patient states that she is having chest pain that is constant that goes from her left chest into her back. States that she is also having pain in left arm.

## 2014-08-03 NOTE — ED Provider Notes (Signed)
CSN: [id number]     Arrival date & time 08/03/14  2248 History  This chart was scribed for Vanetta Mulders, MD by Roxy Cedar, ED Scribe. This patient was seen in room [facility]19[facility] and the patient's care was started at 11:42 PM.   Chief Complaint  Patient presents with  . Chest Pain   Patient is a 34 y.o. female presenting with chest pain. The history is provided by the patient. No language interpreter was used.  Chest Pain Pain location:  L chest Pain quality: aching   Pain radiates to:  Upper back Pain radiates to the back: yes   Pain severity:  Moderate Onset quality:  Gradual Timing:  Constant Progression:  Unchanged Chronicity:  New Relieved by:  Nothing Worsened by:  Nothing tried Associated symptoms: back pain, nausea and vomiting   Associated symptoms: no abdominal pain, no cough, no fever, no headache and no shortness of breath    HPI Comments: Jennifer Gonzales is a 34 y.o. female with a PMHx of fibromyalgia, back pain, mental disorder, migraines and BV, who presents to the Emergency Department complaining of moderate, constant left sided chest pain that radiates into her back and left arm onset 8:00 PM. She states that "it hurts to breath" but denies SOB. She reports associated nausea, headache and 1 episode of emesis at 7 PM. She denies associated sore throat, or body aches. She currently rates her pain as 8/10. [facility]  Past Medical History  Diagnosis Date  . Fibromyalgia   . Back pain   . Mental disorder   . Hx of migraines   . BV (bacterial vaginosis)    Past Surgical History  Procedure Laterality Date  . Cholecystectomy    . Back surgery    . Tonsillectomy    . Cesarean section     Family History  Problem Relation Age of Onset  . Hypertension Other   . Diabetes Other   . Stroke Other   . Heart disease Other   . Cancer Paternal Grandmother     breast   History  Substance Use Topics  . Smoking status: Heavy Tobacco  Smoker -- 0.50 packs/day    Types: Cigarettes  . Smokeless tobacco: Never Used  . Alcohol Use: No   OB History    Gravida Para Term Preterm AB TAB SAB Ectopic Multiple Living   Review of Systems  Constitutional: Negative for fever and chills.  HENT: Negative for congestion, rhinorrhea and sore throat.   Eyes: Negative for visual disturbance.  Respiratory: Negative for cough and shortness of breath.   Cardiovascular: Positive for chest pain. Negative for leg swelling.  Gastrointestinal: Positive for nausea and vomiting. Negative for abdominal pain and diarrhea.  Genitourinary: Negative for dysuria.  Musculoskeletal: Positive for back pain.  Skin: Negative for rash.  Neurological: Negative for headaches.  Hematological: Does not bruise/bleed easily.   Allergies  Ketorolac tromethamine  Home Medications   Prior to Admission medications   Medication Sig Start Date End Date Taking? Authorizing Provider  cetirizine (ZYRTEC) 10 MG tablet Take 10 mg by mouth daily.    Historical Provider, MD  cyclobenzaprine (FLEXERIL) 10 MG tablet Take 1 tablet (10 mg total) by mouth 3 (three) times daily as needed for muscle spasms. 04/06/12   Devoria Albe, MD  escitalopram (LEXAPRO) 20 MG tablet Take 20 mg by mouth daily. 11/27/12  Adline Potter, NP  HYDROcodone-acetaminophen (NORCO/VICODIN) 5-325 MG per tablet Take 1-2 tablets by mouth every 6 (six) hours as needed. 08/04/14   Vanetta Mulders, MD  ibuprofen (ADVIL,MOTRIN) 200 MG tablet Take 800 mg by mouth daily as needed for mild pain or moderate pain.     Historical Provider, MD  medroxyPROGESTERone (DEPO-PROVERA) 150 MG/ML injection use as directed at physician's office every 3 months 09/16/13   Adline Potter, NP  naproxen (NAPROSYN) 500 MG tablet Take 1 tablet (500 mg total) by mouth 2 (two) times daily. 08/04/14   Vanetta Mulders, MD  topiramate (TOPAMAX) 50 MG tablet Take 50 mg by mouth daily.     Historical Provider, MD    Triage Vitals: BP 124/69 mmHg  Pulse 85  Temp(Src) 99 F (37.2 C) (Oral)  Resp 20  Ht  (1.499 m)  Wt 192 lb (87.091 kg)  BMI 38.76 kg/m2  SpO2 93%  Physical Exam  Constitutional: She is oriented to person, place, and time. She appears well-developed and well-nourished. No distress.  HENT:  Head: Normocephalic and atraumatic.  Eyes: EOM are normal. Pupils are equal, round, and reactive to light. No scleral icterus.  Cardiovascular: Normal rate, regular rhythm and normal heart sounds.   Pulmonary/Chest: Effort normal and breath sounds normal.  Abdominal: Soft. Bowel sounds are normal. There is no tenderness.  Musculoskeletal: She exhibits no edema.  Neurological: She is alert and oriented to person, place, and time. No cranial nerve deficit. She exhibits normal muscle tone. Coordination normal.  Skin: She is not diaphoretic.  Psychiatric: She has a normal mood and affect.  Nursing note and vitals reviewed.  ED Course  Procedures (including critical care time)  DIAGNOSTIC STUDIES: Oxygen Saturation is 93% on RA, low by my interpretation.    COORDINATION OF CARE: 11:47 PM- Discussed plans to order diagnostic CXR, EKG and lab work. Pt advised of plan for treatment and pt agrees.  Labs Review Labs Reviewed  BASIC METABOLIC PANEL - Abnormal; Notable for the following:    Sodium 134 (*)    Glucose, Bld 120 (*)    GFR calc non Af Amer 84 (*)    All other components within normal limits  CBC  TROPONIN I  D-DIMER, QUANTITATIVE  URINALYSIS, ROUTINE W REFLEX MICROSCOPIC  PREGNANCY, URINE   Results for orders placed or performed during the hospital encounter of 08/03/14  CBC  Result Value Ref Range   WBC 10.2 4.0 - 10.5 K/uL   RBC 4.61 3.87 - 5.11 MIL/uL   Hemoglobin 14.7 12.0 - 15.0 g/dL   HCT 76.7 34.1 - 93.7 %   MCV 92.6 78.0 - 100.0 fL   MCH 31.9 26.0 - 34.0 pg   MCHC 34.4 30.0 - 36.0 g/dL   RDW 90.2 40.9 - 73.5 %   Platelets 240 150 - 400 K/uL  Basic  metabolic panel  Result Value Ref Range   Sodium 134 (L) 135 - 145 mmol/L   Potassium 4.1 3.5 - 5.1 mmol/L   Chloride 105 96 - 112 mmol/L   CO2 20 19 - 32 mmol/L   Glucose, Bld 120 (H) 70 - 99 mg/dL   BUN 9 6 - 23 mg/dL   Creatinine, Ser 3.29 0.50 - 1.10 mg/dL   Calcium 8.8 8.4 - 92.4 mg/dL   GFR calc non Af Amer 84 (L) >90 mL/min   GFR calc Af Amer >90 >90 mL/min   Anion gap 9 5 - 15  Troponin I (MHP)  Result Value Ref Range   Troponin I <0.03 <0.031 ng/mL  D-dimer, quantitative  Result Value Ref Range   D-Dimer, Quant 0.28 0.00 - 0.48 ug/mL-FEU    Imaging Review Dg Chest Port 1 View  08/03/2014   CLINICAL DATA:  Midsternal chest pain that began 1 hour ago, radiating into left upper extremity  EXAM: PORTABLE CHEST - 1 VIEW  COMPARISON:  None.  FINDINGS: A single AP portable view of the chest demonstrates no focal airspace consolidation or alveolar edema. The lungs are grossly clear. There is no large effusion or pneumothorax. There is mild cardiomegaly. Cardiac and mediastinal contours are otherwise unremarkable.  IMPRESSION: No active disease.   Electronically Signed   By: Ellery Plunkaniel R Mitchell M.D.   On: 08/03/2014 23:44     EKG Interpretation   Date/Time:  Sunday August 03 2014 23:58:28 EDT Ventricular Rate:  70 PR Interval:  129 QRS Duration: 84 QT Interval:  376 QTC Calculation: 406 R Axis:   66 Text Interpretation:  Sinus rhythm Confirmed by Tyller Bowlby  MD, Edwar Coe  ([id number]) on 08/04/2014 12:01:04 AM     MDM   Final diagnoses:  Chest pain, unspecified chest pain type    Workup for the chest pain without any significant findings. Chest x-rays negative for pneumonia or pneumothorax. EKG without any acute changes at all. Troponin was negative d-dimer was negative. Suspect this is chest wall pain or perhaps related to her reflux disease. Will treat with Naprosyn and hydrocodone. Patient has medicine for reflux disease already.    I personally performed the services described  in this documentation, which was scribed in my presence. The recorded information has been reviewed and is accurate.    Vanetta MuldersScott Cailen Texeira, MD 08/04/14 [phone]

## 2014-08-04 LAB — D-DIMER, QUANTITATIVE: D-Dimer, Quant: 0.28 ug/mL-FEU (ref 0.00–0.48)

## 2014-08-04 MED ORDER — HYDROCODONE-ACETAMINOPHEN 5-325 MG PO TABS: 1.0000 | ORAL_TABLET | Freq: Four times a day (QID) | ORAL | Status: DC | PRN

## 2014-08-04 MED ORDER — NAPROXEN 500 MG PO TABS: 500.0000 mg | ORAL_TABLET | Freq: Two times a day (BID) | ORAL | Status: DC

## 2014-08-04 NOTE — Discharge Instructions (Signed)
Workup for the chest pain without any significant findings. No evidence of pneumonia and no evidence of blood clot in the lungs. No evidence of an acute cardiac event. Continue your medicine for reflux. Stop the Motrin. Start the Naprosyn as directed. Then take the hydrocodone as needed for more of severe pain. Return for any new or worse symptoms.

## 2015-02-15 ENCOUNTER — Encounter (HOSPITAL_COMMUNITY): Payer: Self-pay | Admitting: Emergency Medicine

## 2015-02-15 ENCOUNTER — Emergency Department (HOSPITAL_COMMUNITY)
Admission: EM | Admit: 2015-02-15 | Discharge: 2015-02-15 | Disposition: A | Discharge status: Home / Self Care | Payer: Self-pay | Attending: Emergency Medicine | Admitting: Emergency Medicine

## 2015-02-15 DIAGNOSIS — M797 Fibromyalgia: Secondary | ICD-10-CM | POA: Insufficient documentation

## 2015-02-15 DIAGNOSIS — Z8742 Personal history of other diseases of the female genital tract: Secondary | ICD-10-CM | POA: Insufficient documentation

## 2015-02-15 DIAGNOSIS — R5381 Other malaise: Secondary | ICD-10-CM | POA: Insufficient documentation

## 2015-02-15 DIAGNOSIS — Z791 Long term (current) use of non-steroidal anti-inflammatories (NSAID): Secondary | ICD-10-CM | POA: Insufficient documentation

## 2015-02-15 DIAGNOSIS — R42 Dizziness and giddiness: Secondary | ICD-10-CM | POA: Insufficient documentation

## 2015-02-15 DIAGNOSIS — Z8619 Personal history of other infectious and parasitic diseases: Secondary | ICD-10-CM | POA: Insufficient documentation

## 2015-02-15 DIAGNOSIS — Z79899 Other long term (current) drug therapy: Secondary | ICD-10-CM | POA: Insufficient documentation

## 2015-02-15 DIAGNOSIS — F99 Mental disorder, not otherwise specified: Secondary | ICD-10-CM | POA: Insufficient documentation

## 2015-02-15 DIAGNOSIS — J0100 Acute maxillary sinusitis, unspecified: Secondary | ICD-10-CM | POA: Insufficient documentation

## 2015-02-15 DIAGNOSIS — Z720 Tobacco use: Secondary | ICD-10-CM | POA: Insufficient documentation

## 2015-02-15 DIAGNOSIS — G43909 Migraine, unspecified, not intractable, without status migrainosus: Secondary | ICD-10-CM | POA: Insufficient documentation

## 2015-02-15 MED ORDER — PREDNISONE 20 MG PO TABS: 40.0000 mg | ORAL_TABLET | Freq: Every day | ORAL | Status: DC

## 2015-02-15 MED ORDER — DOXYCYCLINE HYCLATE 100 MG PO CAPS: 100.0000 mg | ORAL_CAPSULE | Freq: Two times a day (BID) | ORAL | Status: DC

## 2015-02-15 MED ORDER — PROMETHAZINE HCL 25 MG PO TABS: 25.0000 mg | ORAL_TABLET | Freq: Four times a day (QID) | ORAL | Status: DC | PRN

## 2015-02-15 MED ORDER — TRAMADOL HCL 50 MG PO TABS
50.0000 mg | ORAL_TABLET | Freq: Four times a day (QID) | ORAL | Status: DC | PRN
Start: 2015-02-15 — End: 2015-04-02

## 2015-02-15 NOTE — Discharge Instructions (Signed)

## 2015-02-15 NOTE — ED Provider Notes (Signed)
CSN: [id number]     Arrival date & time 02/15/15  1558 History   First MD Initiated Contact with Patient 02/15/15 1637     Chief Complaint  Patient presents with  . Dizziness     (Consider location/radiation/quality/duration/timing/severity/associated sxs/prior Treatment) HPI Comments: Patient presents to the ER for evaluation of posterior headache, sinus congestion, dizziness, generalized malaise with joint aches. Symptoms began upon awakening this morning. She did take ibuprofen without improvement. She has not had any nausea, vomiting or diarrhea. She denies rash and tick bite, but she does do a lot of work outside.  Patient is a 33 y.o. female presenting with dizziness.  Dizziness Associated symptoms: no shortness of breath     Past Medical History  Diagnosis Date  . Fibromyalgia   . Back pain   . Mental disorder   . Hx of migraines   . BV (bacterial vaginosis)    Past Surgical History  Procedure Laterality Date  . Cholecystectomy    . Back surgery    . Tonsillectomy    . Cesarean section     Family History  Problem Relation Age of Onset  . Hypertension Other   . Diabetes Other   . Stroke Other   . Heart disease Other   . Cancer Paternal Grandmother     breast   Social History  Substance Use Topics  . Smoking status: Heavy Tobacco Smoker -- 0.50 packs/day    Types: Cigarettes  . Smokeless tobacco: Never Used  . Alcohol Use: Yes     Comment: occassionally   OB History    Gravida Para Term Preterm AB TAB SAB Ectopic Multiple Living   Review of Systems  Constitutional: Positive for fatigue.  HENT: Positive for congestion.   Respiratory: Negative for shortness of breath.   Musculoskeletal: Positive for myalgias and arthralgias.  Neurological: Positive for dizziness.  All other systems reviewed and are negative.     Allergies  Ketorolac tromethamine  Home Medications   Prior to Admission medications   Medication Sig Start Date End  Date Taking? Authorizing Provider  cetirizine (ZYRTEC) 10 MG tablet Take 10 mg by mouth daily.    Historical Provider, MD  cyclobenzaprine (FLEXERIL) 10 MG tablet Take 1 tablet (10 mg total) by mouth 3 (three) times daily as needed for muscle spasms. 04/06/12   Devoria Albe, MD  escitalopram (LEXAPRO) 20 MG tablet Take 20 mg by mouth daily. 11/27/12   Adline Potter, NP  HYDROcodone-acetaminophen (NORCO/VICODIN) 5-325 MG per tablet Take 1-2 tablets by mouth every 6 (six) hours as needed. 08/04/14   Vanetta Mulders, MD  ibuprofen (ADVIL,MOTRIN) 200 MG tablet Take 800 mg by mouth daily as needed for mild pain or moderate pain.     Historical Provider, MD  medroxyPROGESTERone (DEPO-PROVERA) 150 MG/ML injection use as directed at physician's office every 3 months 09/16/13   Adline Potter, NP  naproxen (NAPROSYN) 500 MG tablet Take 1 tablet (500 mg total) by mouth 2 (two) times daily. 08/04/14   Vanetta Mulders, MD  topiramate (TOPAMAX) 50 MG tablet Take 50 mg by mouth daily.     Historical Provider, MD   BP 124/86 mmHg  Pulse 82  Temp(Src) 98.4 F (36.9 C) (Oral)  Resp 16  Ht 5' (1.524 m)  Wt 192 lb (87.091 kg)  BMI 37.50 kg/m2  SpO2 100% Physical Exam  Constitutional: She is oriented to person, place, and  time. She appears well-developed and well-nourished. No distress.  HENT:  Head: Normocephalic and atraumatic.  Right Ear: Hearing normal.  Left Ear: Hearing normal.  Nose: Right sinus exhibits maxillary sinus tenderness. Left sinus exhibits maxillary sinus tenderness.  Mouth/Throat: Oropharynx is clear and moist and mucous membranes are normal.  Eyes: Conjunctivae and EOM are normal. Pupils are equal, round, and reactive to light.  Neck: Normal range of motion. Neck supple.  Cardiovascular: Regular rhythm, S1 normal and S2 normal.  Exam reveals no gallop and no friction rub.   No murmur heard. Pulmonary/Chest: Effort normal and breath sounds normal. No respiratory distress. She exhibits  no tenderness.  Abdominal: Soft. Normal appearance and bowel sounds are normal. There is no hepatosplenomegaly. There is no tenderness. There is no rebound, no guarding, no tenderness at McBurney's point and negative Murphy's sign. No hernia.  Musculoskeletal: Normal range of motion.  Neurological: She is alert and oriented to person, place, and time. She has normal strength. No cranial nerve deficit or sensory deficit. Coordination normal. GCS eye subscore is 4. GCS verbal subscore is 5. GCS motor subscore is 6.  Extraocular muscle movement: normal No visual field cut Pupils: equal and reactive both direct and consensual response is normal No nystagmus present    Sensory function is intact to light touch, pinprick Proprioception intact  Grip strength 5/5 symmetric in upper extremities No pronator drift Normal finger to nose bilaterally  Lower extremity strength 5/5 against gravity Normal heel to shin bilaterally  Gait: normal   Skin: Skin is warm, dry and intact. No rash noted. No cyanosis.  Psychiatric: She has a normal mood and affect. Her speech is normal and behavior is normal. Thought content normal.  Nursing note and vitals reviewed.   ED Course  Procedures (including critical care time) Labs Review Labs Reviewed - No data to display  Imaging Review No results found. I have personally reviewed and evaluated these images and lab results as part of my medical decision-making.   EKG Interpretation None      MDM   Final diagnoses:  None  sinusitis  Presents to the ER for evaluation of dizziness, headache, nasal congestion. Patient's neck is supple, no signs of meningitis. She is afebrile. All vitals are normal. Patient does have nasal congestion and sinus tenderness. Likely inflammatory, possibly infectious. Patient also complaining of malaise, myalgias and arthralgias. She is unaware of any tick bites, but this needs to be considered. Patient will be treated with  prednisone, doxycycline. She also does have a history of migraines. The headache currently does not resemble a migraine. She is driving, cannot get any sedating medication. Will prescribe Ultram and Phenergan.  Jennifer Crease, MD 02/15/15 [phone]

## 2015-02-15 NOTE — ED Notes (Signed)
PT c/o waking up this morning with headache, low grade fever and dizziness. PT also c/o sinus congestion and pressure starting today. PT states taking ibuprofen at 1400.

## 2015-03-26 ENCOUNTER — Other Ambulatory Visit: Payer: Self-pay | Admitting: Physician Assistant

## 2015-03-26 LAB — LIPID PANEL
CHOLESTEROL: 147 mg/dL (ref 125–200)
HDL: 23 mg/dL — ABNORMAL LOW (ref >=46)
LDL CALC: 110 mg/dL (ref <130)
Total CHOL/HDL Ratio: 6.4 Ratio — ABNORMAL HIGH (ref <=5.0)
Triglycerides: 68 mg/dL (ref <150)
VLDL: 14 mg/dL (ref <30)

## 2015-04-02 ENCOUNTER — Encounter: Payer: Self-pay | Admitting: Physician Assistant

## 2015-04-02 ENCOUNTER — Ambulatory Visit: Payer: Self-pay | Admitting: Physician Assistant

## 2015-04-02 VITALS — BP 100/64 | HR 73 | Temp 98.1°F | Ht 59.0 in | Wt 184.6 lb

## 2015-04-02 DIAGNOSIS — E669 Obesity, unspecified: Secondary | ICD-10-CM

## 2015-04-02 DIAGNOSIS — K219 Gastro-esophageal reflux disease without esophagitis: Secondary | ICD-10-CM

## 2015-04-02 DIAGNOSIS — E785 Hyperlipidemia, unspecified: Secondary | ICD-10-CM

## 2015-04-02 MED ORDER — PANTOPRAZOLE SODIUM 40 MG PO TBEC: 40.0000 mg | DELAYED_RELEASE_TABLET | Freq: Every day | ORAL | Status: DC

## 2015-04-02 NOTE — Progress Notes (Signed)
BP 100/64 mmHg  Pulse 73  Temp(Src) 98.1 F (36.7 C)  Ht  (1.499 m)  Wt 184 lb 9.6 oz (83.734 kg)  BMI 37.26 kg/m2  SpO2 98%   Subjective:    Patient ID: Jennifer Gonzales Done, female    DOB: May 10, 1981, 34 y.o.   MRN: [id number]  HPI: Jennifer Gonzales is a 34 y.o. female presenting on 04/02/2015 for Hyperlipidemia and Gastroesophageal Reflux   HPI Pt now working for [employer]. She gets insurance when she gets to 1000 hours.  Her gerd is flared up b/c she isn't taking any meds now  Pt c/o ears ringing in the R ear only for the past 2 months or so. States after it rings, she will get where she can't hear and then gets dizzy and nausea- states it lasts for 10-15 min and she can hear again and the ringing is gone.  Relevant past medical, surgical, family and social history reviewed and updated as indicated. Interim medical history since our last visit reviewed. Allergies and medications reviewed and updated.   Current outpatient prescriptions:  .  ibuprofen (ADVIL,MOTRIN) 200 MG tablet, Take 800 mg by mouth daily as needed for mild pain or moderate pain. , Disp: , Rfl:  .  medroxyPROGESTERone (DEPO-PROVERA) 150 MG/ML injection, use as directed at physician's office every 3 months, Disp: 1 mL, Rfl: 3   Review of Systems  Constitutional: Positive for appetite change and fatigue. Negative for fever, chills, diaphoresis and unexpected weight change.  HENT: Positive for ear pain. Negative for congestion, dental problem, drooling, facial swelling, hearing loss, mouth sores, sneezing, sore throat, trouble swallowing and voice change.   Eyes: Negative for pain, discharge, redness, itching and visual disturbance.  Respiratory: Negative for cough, choking, shortness of breath and wheezing.   Cardiovascular: Negative for chest pain, palpitations and leg swelling.  Gastrointestinal: Negative for vomiting, abdominal pain, diarrhea, constipation and blood in stool.  Endocrine: Positive for cold  intolerance. Negative for heat intolerance and polydipsia.  Genitourinary: Negative for dysuria, hematuria and decreased urine volume.  Musculoskeletal: Positive for back pain. Negative for arthralgias and gait problem.  Skin: Negative for rash.  Allergic/Immunologic: Positive for environmental allergies.  Neurological: Positive for light-headedness and headaches. Negative for seizures and syncope.  Hematological: Negative for adenopathy.  Psychiatric/Behavioral: Positive for agitation. Negative for suicidal ideas and dysphoric mood. The patient is nervous/anxious.     Per HPI unless specifically indicated above     Objective:    BP 100/64 mmHg  Pulse 73  Temp(Src) 98.1 F (36.7 C)  Ht  (1.499 m)  Wt 184 lb 9.6 oz (83.734 kg)  BMI 37.26 kg/m2  SpO2 98%  Wt Readings from Last 3 Encounters:  04/02/15 184 lb 9.6 oz (83.734 kg)  02/15/15 192 lb (87.091 kg)  08/03/14 192 lb (87.091 kg)    Physical Exam  Constitutional: She is oriented to person, place, and time. She appears well-developed and well-nourished.  HENT:  Head: Normocephalic and atraumatic.  Right Ear: Hearing, tympanic membrane, external ear and ear canal normal.  Left Ear: Hearing, tympanic membrane, external ear and ear canal normal.  Mouth/Throat: Uvula is midline and oropharynx is clear and moist.  Neck: Neck supple.  Cardiovascular: Normal rate and regular rhythm.   Pulmonary/Chest: Effort normal and breath sounds normal.  Abdominal: Soft. Bowel sounds are normal. She exhibits no mass. There is no tenderness.  Musculoskeletal: She exhibits no edema.  Lymphadenopathy:    She has no cervical  adenopathy.  Neurological: She is alert and oriented to person, place, and time.  Skin: Skin is warm and dry.  Psychiatric: She has a normal mood and affect. Her behavior is normal.  Vitals reviewed.   Results for orders placed or performed in visit on 03/26/15  Lipid panel  Result Value Ref Range   Cholesterol  147 125 - 200 mg/dL   Triglycerides 68 <161<150 mg/dL   HDL 23 (L) >=09>=46 mg/dL   Total CHOL/HDL Ratio 6.4 (H) <=5.0 Ratio   VLDL 14 <30 mg/dL   LDL Cholesterol 604110 <540<130 mg/dL      Assessment & Plan:   Encounter Diagnoses  Name Primary?  . Hyperlipemia Yes  . Gastroesophageal reflux disease, esophagitis presence not specified   . Obesity, unspecified    -reviewed labs with pt -counseled on lowfat diet and regular execise. Gave pt handout on lowfat diet -rx protonix for GERD.  Sent this as it's less expensive than ppi she was using.  She will let us know if she prefers to go back to the more expensive medication -f/u 4 mo. rto sooner prn

## 2015-04-02 NOTE — Patient Instructions (Signed)

## 2015-04-05 DIAGNOSIS — E785 Hyperlipidemia, unspecified: Secondary | ICD-10-CM | POA: Insufficient documentation

## 2015-04-05 DIAGNOSIS — E669 Obesity, unspecified: Secondary | ICD-10-CM | POA: Insufficient documentation

## 2015-04-05 DIAGNOSIS — K219 Gastro-esophageal reflux disease without esophagitis: Secondary | ICD-10-CM | POA: Insufficient documentation

## 2015-04-08 ENCOUNTER — Emergency Department (HOSPITAL_COMMUNITY): Payer: Self-pay

## 2015-04-08 ENCOUNTER — Emergency Department (HOSPITAL_COMMUNITY)
Admission: EM | Admit: 2015-04-08 | Discharge: 2015-04-08 | Disposition: A | Discharge status: Home / Self Care | Payer: Self-pay | Attending: Emergency Medicine | Admitting: Emergency Medicine

## 2015-04-08 ENCOUNTER — Encounter (HOSPITAL_COMMUNITY): Payer: Self-pay | Admitting: Emergency Medicine

## 2015-04-08 DIAGNOSIS — X58XXXA Exposure to other specified factors, initial encounter: Secondary | ICD-10-CM | POA: Insufficient documentation

## 2015-04-08 DIAGNOSIS — Z8742 Personal history of other diseases of the female genital tract: Secondary | ICD-10-CM | POA: Insufficient documentation

## 2015-04-08 DIAGNOSIS — S86812A Strain of other muscle(s) and tendon(s) at lower leg level, left leg, initial encounter: Secondary | ICD-10-CM | POA: Insufficient documentation

## 2015-04-08 DIAGNOSIS — Z8679 Personal history of other diseases of the circulatory system: Secondary | ICD-10-CM | POA: Insufficient documentation

## 2015-04-08 DIAGNOSIS — S8392XA Sprain of unspecified site of left knee, initial encounter: Secondary | ICD-10-CM | POA: Insufficient documentation

## 2015-04-08 DIAGNOSIS — Y9289 Other specified places as the place of occurrence of the external cause: Secondary | ICD-10-CM | POA: Insufficient documentation

## 2015-04-08 DIAGNOSIS — Z79899 Other long term (current) drug therapy: Secondary | ICD-10-CM | POA: Insufficient documentation

## 2015-04-08 DIAGNOSIS — Z8659 Personal history of other mental and behavioral disorders: Secondary | ICD-10-CM | POA: Insufficient documentation

## 2015-04-08 DIAGNOSIS — Z8619 Personal history of other infectious and parasitic diseases: Secondary | ICD-10-CM | POA: Insufficient documentation

## 2015-04-08 DIAGNOSIS — Y998 Other external cause status: Secondary | ICD-10-CM | POA: Insufficient documentation

## 2015-04-08 DIAGNOSIS — Y9389 Activity, other specified: Secondary | ICD-10-CM | POA: Insufficient documentation

## 2015-04-08 DIAGNOSIS — F17210 Nicotine dependence, cigarettes, uncomplicated: Secondary | ICD-10-CM | POA: Insufficient documentation

## 2015-04-08 DIAGNOSIS — Z8739 Personal history of other diseases of the musculoskeletal system and connective tissue: Secondary | ICD-10-CM | POA: Insufficient documentation

## 2015-04-08 MED ORDER — NAPROXEN 500 MG PO TABS: 500.0000 mg | ORAL_TABLET | Freq: Two times a day (BID) | ORAL | Status: DC

## 2015-04-08 NOTE — ED Notes (Signed)
Patient states that she woke up this am with pain in left knee unable to bare weight on that leg. Denies injury.

## 2015-04-08 NOTE — ED Notes (Signed)
Patient with no complaints at this time. Respirations even and unlabored. Skin warm/dry. Discharge instructions reviewed with patient at this time. Patient given opportunity to voice concerns/ask questions. Patient discharged at this time and left Emergency Department with steady gait.   

## 2015-04-08 NOTE — Discharge Instructions (Signed)
Knee Sprain °A knee sprain is a tear in the strong bands of tissue that connect the bones (ligaments) of your knee. °HOME CARE °· Raise (elevate) your injured knee to lessen puffiness (swelling). °· To ease pain and puffiness, put ice on the injured area. °¨ Put ice in a plastic bag. °¨ Place a towel between your skin and the bag. °¨ Leave the ice on for 20 minutes, 2-3 times a day. °· Only take medicine as told by your doctor. °· Do not leave your knee unprotected until pain and stiffness go away (usually 4-6 weeks). °· If you have a cast or splint, do not get it wet. If your doctor told you to not take it off, cover it with a plastic bag when you shower or bathe. Do not swim. °· Your doctor may have you do exercises to prevent or limit permanent weakness and stiffness. °GET HELP RIGHT AWAY IF:  °· Your cast or splint becomes damaged. °· Your pain gets worse. °· You have a lot of pain, puffiness, or numbness below the cast or splint. °MAKE SURE YOU:  °· Understand these instructions. °· Will watch your condition. °· Will get help right away if you are not doing well or get worse. °  °This information is not intended to replace advice given to you by your health care provider. Make sure you discuss any questions you have with your health care provider. °  °Document Released: 04/20/2009 Document Revised: 05/07/2013 Document Reviewed: 01/08/2013 °Elsevier Interactive Patient Education ©2016 [employer] ° °

## 2015-04-08 NOTE — ED Notes (Signed)
Pt reports left knee pain since this am. Pt denies any known injury. No deformity noted.

## 2015-04-08 NOTE — ED Provider Notes (Signed)
CSN: [id number]     Arrival date & time 04/08/15  0630 History   First MD Initiated Contact with Patient 04/08/15 [phone]     Chief Complaint  Patient presents with  . Knee Pain     (Consider location/radiation/quality/duration/timing/severity/associated sxs/prior Treatment) HPI   Jennifer Gonzales is a 34 y.o. female who presents to the Emergency Department complaining of left knee pain that began suddenly last evening.  She states that she was at work when the pain began, but doesn't recall a specific injury.  She describes a sharp pain just below the kneecap and pain is worse with bending of the knee and weight bearing.  She applied icy hot last night with mild relief.  She also notes swelling to her knee this morning.  She states that she is bending, twisting and walking a lot at her job.  She denies fever, chills, recent illness, redness of the joint and pain or numbness of the extremity.     Past Medical History  Diagnosis Date  . Fibromyalgia   . Back pain   . Mental disorder   . Hx of migraines   . BV (bacterial vaginosis)    Past Surgical History  Procedure Laterality Date  . Cholecystectomy    . Back surgery    . Tonsillectomy    . Cesarean section     Family History  Problem Relation Age of Onset  . Hypertension Other   . Diabetes Other   . Stroke Other   . Heart disease Other   . Cancer Paternal Grandmother     breast   Social History  Substance Use Topics  . Smoking status: Heavy Tobacco Smoker -- 0.50 packs/day for 23 years    Types: Cigarettes  . Smokeless tobacco: Never Used  . Alcohol Use: Yes     Comment: occassionally   OB History    Gravida Para Term Preterm AB TAB SAB Ectopic Multiple Living   Review of Systems  Constitutional: Negative for fever and chills.  Genitourinary: Negative for dysuria and difficulty urinating.  Musculoskeletal: Positive for joint swelling and arthralgias.  Skin: Negative for color change and wound.   All other systems reviewed and are negative.     Allergies  Ketorolac tromethamine  Home Medications   Prior to Admission medications   Medication Sig Start Date End Date Taking? Authorizing Provider  ibuprofen (ADVIL,MOTRIN) 200 MG tablet Take 800 mg by mouth daily as needed for mild pain or moderate pain.     Historical Provider, MD  medroxyPROGESTERone (DEPO-PROVERA) 150 MG/ML injection use as directed at physician's office every 3 months 09/16/13   Adline Potter, NP  pantoprazole (PROTONIX) 40 MG tablet Take 1 tablet (40 mg total) by mouth daily. 04/02/15   Jacquelin Hawking, PA-C   BP 122/72 mmHg  Pulse 71  Temp(Src) 98.2 F (36.8 C) (Oral)  Resp 20  Ht  (1.499 m)  Wt 81.761 kg  BMI 36.39 kg/m2  SpO2 100% Physical Exam  Constitutional: She is oriented to person, place, and time. She appears well-developed and well-nourished. No distress.  Cardiovascular: Normal rate, regular rhythm, normal heart sounds and intact distal pulses.   Pulmonary/Chest: Effort normal and breath sounds normal.  Musculoskeletal: She exhibits tenderness.  ttp of the anterior, lower left knee.  Mild crepitus present through ROM.  No erythema, edema, joint effusion, or step-off deformity.  DP pulse brisk, distal  sensation intact. Calf is soft and NT.  Neurological: She is alert and oriented to person, place, and time. She exhibits normal muscle tone. Coordination normal.  Skin: Skin is warm and dry. No erythema.  Nursing note and vitals reviewed.   ED Course  Procedures (including critical care time)  Imaging Review Dg Knee Complete 4 Views Left  04/08/2015  CLINICAL DATA:  Generalized left knee pain, difficulty bearing weight since last night. No known injury. EXAM: LEFT KNEE - COMPLETE 4+ VIEW COMPARISON:  03/11/2012 FINDINGS: There is no evidence of fracture, dislocation, or joint effusion. There is no evidence of arthropathy or other focal bone abnormality. Soft tissues are  unremarkable. IMPRESSION: Negative. Electronically Signed   By: Charlett NoseKevin  Dover M.D.   On: 04/08/2015 09:04   I have personally reviewed and evaluated these images and lab results as part of my medical decision-making.    MDM   Final diagnoses:  Knee sprain and strain, left, initial encounter   ACE wrap applied, pain improved.  No concerning sx for septic joint, compartments are soft.  Sx's likely related to sprain.  Pt agrees to symptomatic tx and close ortho f/u in one week if not improving     Pauline Ausammy Siddiq Kaluzny, PA-C 04/08/15 [phone]  Azalia BilisKevin Campos, MD 04/08/15 1421

## 2015-05-16 ENCOUNTER — Emergency Department (HOSPITAL_COMMUNITY)
Admission: EM | Admit: 2015-05-16 | Discharge: 2015-05-16 | Disposition: A | Discharge status: Home / Self Care | Payer: Self-pay | Attending: Emergency Medicine | Admitting: Emergency Medicine

## 2015-05-16 ENCOUNTER — Encounter (HOSPITAL_COMMUNITY): Payer: Self-pay | Admitting: Emergency Medicine

## 2015-05-16 DIAGNOSIS — Z8742 Personal history of other diseases of the female genital tract: Secondary | ICD-10-CM | POA: Insufficient documentation

## 2015-05-16 DIAGNOSIS — Z8659 Personal history of other mental and behavioral disorders: Secondary | ICD-10-CM | POA: Insufficient documentation

## 2015-05-16 DIAGNOSIS — J069 Acute upper respiratory infection, unspecified: Secondary | ICD-10-CM | POA: Insufficient documentation

## 2015-05-16 DIAGNOSIS — H6692 Otitis media, unspecified, left ear: Secondary | ICD-10-CM | POA: Insufficient documentation

## 2015-05-16 DIAGNOSIS — Z791 Long term (current) use of non-steroidal anti-inflammatories (NSAID): Secondary | ICD-10-CM | POA: Insufficient documentation

## 2015-05-16 DIAGNOSIS — Z8739 Personal history of other diseases of the musculoskeletal system and connective tissue: Secondary | ICD-10-CM | POA: Insufficient documentation

## 2015-05-16 DIAGNOSIS — Z8679 Personal history of other diseases of the circulatory system: Secondary | ICD-10-CM | POA: Insufficient documentation

## 2015-05-16 DIAGNOSIS — B9789 Other viral agents as the cause of diseases classified elsewhere: Secondary | ICD-10-CM

## 2015-05-16 DIAGNOSIS — F17210 Nicotine dependence, cigarettes, uncomplicated: Secondary | ICD-10-CM | POA: Insufficient documentation

## 2015-05-16 MED ORDER — AMOXICILLIN 500 MG PO CAPS: 500.0000 mg | ORAL_CAPSULE | Freq: Three times a day (TID) | ORAL | Status: DC

## 2015-05-16 NOTE — ED Notes (Signed)
Onset today, multiple symptoms, cough, headache, eyes hurting, ear, nose, and throat pain.

## 2015-05-16 NOTE — ED Notes (Signed)
Patient given discharge instruction, verbalized understand. Patient ambulatory out of the department.  

## 2015-05-16 NOTE — ED Provider Notes (Signed)
CSN: [id number]     Arrival date & time 05/16/15  0248 History   First MD Initiated Contact with Patient 05/16/15 [phone]     Chief Complaint  Patient presents with  . Cough     Patient is a 34 y.o. female presenting with cough. The history is provided by the patient.  Cough Cough characteristics:  Non-productive Severity:  Moderate Onset quality:  Gradual Duration:  2 days Timing:  Intermittent Progression:  Worsening Chronicity:  New Relieved by:  Nothing Worsened by:  Nothing tried Associated symptoms: chills, ear pain, myalgias, sinus congestion and sore throat   Associated symptoms: no fever     Past Medical History  Diagnosis Date  . Fibromyalgia   . Back pain   . Mental disorder   . Hx of migraines   . BV (bacterial vaginosis)    Past Surgical History  Procedure Laterality Date  . Cholecystectomy    . Back surgery    . Tonsillectomy    . Cesarean section     Family History  Problem Relation Age of Onset  . Hypertension Other   . Diabetes Other   . Stroke Other   . Heart disease Other   . Cancer Paternal Grandmother     breast   Social History  Substance Use Topics  . Smoking status: Heavy Tobacco Smoker -- 0.50 packs/day for 23 years    Types: Cigarettes  . Smokeless tobacco: Never Used  . Alcohol Use: Yes     Comment: occassionally   OB History    Gravida Para Term Preterm AB TAB SAB Ectopic Multiple Living   2 2        2      Review of Systems  Constitutional: Positive for chills. Negative for fever.  HENT: Positive for ear pain and sore throat.   Respiratory: Positive for cough.   Musculoskeletal: Positive for myalgias.      Allergies  Ketorolac tromethamine  Home Medications   Prior to Admission medications   Medication Sig Start Date End Date Taking? Authorizing Provider  amoxicillin (AMOXIL) 500 MG capsule Take 1 capsule (500 mg total) by mouth 3 (three) times daily. 05/16/15   Zadie Rhineonald Madix Blowe, MD  cetirizine (ZYRTEC) 10 MG tablet  Take 10 mg by mouth daily as needed for allergies.    Historical Provider, MD  medroxyPROGESTERone (DEPO-PROVERA) 150 MG/ML injection use as directed at physician's office every 3 months 09/16/13   Adline PotterJennifer A Griffin, NP  naproxen (NAPROSYN) 500 MG tablet Take 1 tablet (500 mg total) by mouth 2 (two) times daily with a meal. 04/08/15   Tammy Triplett, PA-C   BP 121/80 mmHg  Pulse 88  Temp(Src) 98.1 F (36.7 C) (Oral)  Resp 18  Ht 4' 11.5" (1.511 m)  Wt 78.472 kg  BMI 34.37 kg/m2  SpO2 100% Physical Exam CONSTITUTIONAL: Well developed/well nourished HEAD: Normocephalic/atraumatic EYES: EOMI ENMT: Mucous membranes moist, left TM erythematous with bulging of membrane NECK: supple no meningeal signs CV: S1/S2 noted, no murmurs/rubs/gallops noted LUNGS: Lungs are clear to auscultation bilaterally, no apparent distress ABDOMEN: soft, nontender NEURO: Pt is awake/alert/appropriate, moves all extremitiesx4.  No facial droop.   EXTREMITIES: pulses normal/equal, full ROM SKIN: warm, color normal PSYCH: no abnormalities of mood noted, alert and oriented to situation  ED Course  Procedures   Suspect viral illness and now with left otitis media   MDM   Final diagnoses:  Viral URI with cough  Acute left otitis media, recurrence not specified, unspecified  otitis media type    Nursing notes including past medical history and social history reviewed and considered in documentation     Zadie Rhine, MD 05/16/15 [phone]

## 2015-07-30 ENCOUNTER — Emergency Department (HOSPITAL_COMMUNITY)
Admission: EM | Admit: 2015-07-30 | Discharge: 2015-07-31 | Disposition: A | Discharge status: Home / Self Care | Payer: Self-pay | Attending: Emergency Medicine | Admitting: Emergency Medicine

## 2015-07-30 ENCOUNTER — Encounter (HOSPITAL_COMMUNITY): Payer: Self-pay | Admitting: *Deleted

## 2015-07-30 ENCOUNTER — Ambulatory Visit: Payer: Self-pay | Admitting: Physician Assistant

## 2015-07-30 DIAGNOSIS — Z79899 Other long term (current) drug therapy: Secondary | ICD-10-CM | POA: Insufficient documentation

## 2015-07-30 DIAGNOSIS — F17210 Nicotine dependence, cigarettes, uncomplicated: Secondary | ICD-10-CM | POA: Insufficient documentation

## 2015-07-30 DIAGNOSIS — R102 Pelvic and perineal pain: Secondary | ICD-10-CM | POA: Insufficient documentation

## 2015-07-30 DIAGNOSIS — R52 Pain, unspecified: Secondary | ICD-10-CM

## 2015-07-30 LAB — CBC WITH DIFFERENTIAL/PLATELET
BASOS ABS: 0 10*3/uL (ref 0.0–0.1)
BASOS PCT: 0 %
Eosinophils Absolute: 0.5 10*3/uL (ref 0.0–0.7)
Eosinophils Relative: 5 %
HEMATOCRIT: 40.5 % (ref 36.0–46.0)
Hemoglobin: 13.6 g/dL (ref 12.0–15.0)
LYMPHS PCT: 38 %
Lymphs Abs: 3.8 10*3/uL (ref 0.7–4.0)
MCH: 31.6 pg (ref 26.0–34.0)
MCHC: 33.6 g/dL (ref 30.0–36.0)
MCV: 94.2 fL (ref 78.0–100.0)
MONO ABS: 0.5 10*3/uL (ref 0.1–1.0)
Monocytes Relative: 5 %
NEUTROS ABS: 5.2 10*3/uL (ref 1.7–7.7)
Neutrophils Relative %: 52 %
PLATELETS: 247 10*3/uL (ref 150–400)
RBC: 4.3 MIL/uL (ref 3.87–5.11)
RDW: 12.8 % (ref 11.5–15.5)
WBC: 10 10*3/uL (ref 4.0–10.5)

## 2015-07-30 LAB — WET PREP, GENITAL
SPERM: NONE SEEN
Trich, Wet Prep: NONE SEEN
WBC WET PREP: NONE SEEN
Yeast Wet Prep HPF POC: NONE SEEN

## 2015-07-30 LAB — URINALYSIS, ROUTINE W REFLEX MICROSCOPIC
Bilirubin Urine: NEGATIVE
Glucose, UA: NEGATIVE mg/dL
HGB URINE DIPSTICK: NEGATIVE
Ketones, ur: NEGATIVE mg/dL
LEUKOCYTES UA: NEGATIVE
NITRITE: NEGATIVE
PROTEIN: NEGATIVE mg/dL
SPECIFIC GRAVITY, URINE: 1.025 (ref 1.005–1.030)
pH: 6 (ref 5.0–8.0)

## 2015-07-30 LAB — COMPREHENSIVE METABOLIC PANEL
ALBUMIN: 3.9 g/dL (ref 3.5–5.0)
ALT: 13 U/L — AB (ref 14–54)
AST: 12 U/L — AB (ref 15–41)
Alkaline Phosphatase: 54 U/L (ref 38–126)
Anion gap: 6 (ref 5–15)
BILIRUBIN TOTAL: 0.4 mg/dL (ref 0.3–1.2)
BUN: 17 mg/dL (ref 6–20)
CHLORIDE: 108 mmol/L (ref 101–111)
CO2: 24 mmol/L (ref 22–32)
CREATININE: 0.73 mg/dL (ref 0.44–1.00)
Calcium: 8.5 mg/dL — ABNORMAL LOW (ref 8.9–10.3)
GFR calc Af Amer: >60 mL/min (ref >60)
GFR calc non Af Amer: >60 mL/min (ref >60)
GLUCOSE: 88 mg/dL (ref 65–99)
POTASSIUM: 3.7 mmol/L (ref 3.5–5.1)
Sodium: 138 mmol/L (ref 135–145)
Total Protein: 6.7 g/dL (ref 6.5–8.1)

## 2015-07-30 LAB — PREGNANCY, URINE: PREG TEST UR: NEGATIVE

## 2015-07-30 LAB — LIPASE, BLOOD: Lipase: 32 U/L (ref 11–51)

## 2015-07-30 MED ORDER — ONDANSETRON HCL 4 MG/2ML IJ SOLN
4.0000 mg | Freq: Once | INTRAMUSCULAR | Status: AC
  Administered 2015-07-30: 4 mg via INTRAVENOUS
  Filled 2015-07-30: qty 2

## 2015-07-30 MED ORDER — MORPHINE SULFATE (PF) 4 MG/ML IV SOLN
4.0000 mg | Freq: Once | INTRAVENOUS | Status: AC
  Administered 2015-07-30: 4 mg via INTRAVENOUS
  Filled 2015-07-30: qty 1

## 2015-07-30 NOTE — ED Notes (Signed)
Pt with RLQ abd pain x 1 hour, denies N/V/D

## 2015-07-31 ENCOUNTER — Emergency Department (HOSPITAL_COMMUNITY): Payer: Self-pay

## 2015-07-31 MED ORDER — OXYCODONE-ACETAMINOPHEN 5-325 MG PO TABS: 2.0000 | ORAL_TABLET | ORAL | Status: DC | PRN

## 2015-07-31 MED ORDER — IOHEXOL 300 MG/ML  SOLN
100.0000 mL | Freq: Once | INTRAMUSCULAR | Status: AC | PRN
  Administered 2015-07-31: 100 mL via INTRAVENOUS

## 2015-07-31 MED ORDER — HYDROMORPHONE HCL 1 MG/ML IJ SOLN
1.0000 mg | Freq: Once | INTRAMUSCULAR | Status: AC
  Administered 2015-07-31: 1 mg via INTRAVENOUS
  Filled 2015-07-31: qty 1

## 2015-07-31 MED ORDER — IBUPROFEN 800 MG PO TABS: 800.0000 mg | ORAL_TABLET | Freq: Three times a day (TID) | ORAL | Status: DC

## 2015-07-31 NOTE — ED Notes (Signed)
Pt gone over to CT 

## 2015-07-31 NOTE — ED Provider Notes (Signed)
CSN: [id number]     Arrival date & time 07/30/15  2125 History   First MD Initiated Contact with Patient 07/30/15 2208     Chief Complaint  Patient presents with  . Abdominal Pain     (Consider location/radiation/quality/duration/timing/severity/associated sxs/prior Treatment) The history is provided by the patient.   Jennifer Gonzales is a 35 y.o. female with a medical and surgical history outlined below presenting with a one hour history of sudden onset right lower pelvic pain prior to arrival here tonight.  Her pain is sharp, stabbing and constant and was accompanied by nausea. She denies vomiting, dysuria, hematuria, vaginal discharge, fevers, chills.  Her pain is worsened with movement and she has found no alleviators. LMP unknown on depo. No prior history of similar sx.      Past Medical History  Diagnosis Date  . Fibromyalgia   . Back pain   . Mental disorder   . Hx of migraines   . BV (bacterial vaginosis)    Past Surgical History  Procedure Laterality Date  . Cholecystectomy    . Back surgery    . Tonsillectomy    . Cesarean section     Family History  Problem Relation Age of Onset  . Hypertension Other   . Diabetes Other   . Stroke Other   . Heart disease Other   . Cancer Paternal Grandmother     breast   Social History  Substance Use Topics  . Smoking status: Heavy Tobacco Smoker -- 0.50 packs/day for 23 years    Types: Cigarettes  . Smokeless tobacco: Never Used  . Alcohol Use: No   OB History    Gravida Para Term Preterm AB TAB SAB Ectopic Multiple Living   Review of Systems  Constitutional: Negative for fever and chills.  HENT: Negative for congestion and sore throat.   Eyes: Negative.   Respiratory: Negative for chest tightness and shortness of breath.   Cardiovascular: Negative for chest pain.  Gastrointestinal: Positive for nausea. Negative for vomiting and abdominal pain.  Genitourinary: Positive for pelvic pain. Negative for  dysuria, urgency, hematuria, vaginal discharge and vaginal pain.  Musculoskeletal: Negative for joint swelling, arthralgias and neck pain.  Skin: Negative.  Negative for rash and wound.  Neurological: Negative for dizziness, weakness, light-headedness, numbness and headaches.  Psychiatric/Behavioral: Negative.       Allergies  Ketorolac tromethamine  Home Medications   Prior to Admission medications   Medication Sig Start Date End Date Taking? Authorizing Provider  pseudoephedrine (SUDAFED) 60 MG tablet Take 120 mg by mouth every 4 (four) hours as needed for congestion.   Yes Historical Provider, MD  amoxicillin (AMOXIL) 500 MG capsule Take 1 capsule (500 mg total) by mouth 3 (three) times daily. Patient not taking: Reported on 07/30/2015 05/16/15   Zadie Rhine, MD  cetirizine (ZYRTEC) 10 MG tablet Take 10 mg by mouth daily as needed for allergies.    Historical Provider, MD  medroxyPROGESTERone (DEPO-PROVERA) 150 MG/ML injection use as directed at physician's office every 3 months 09/16/13   Adline Potter, NP  naproxen (NAPROSYN) 500 MG tablet Take 1 tablet (500 mg total) by mouth 2 (two) times daily with a meal. Patient not taking: Reported on 07/30/2015 04/08/15   Tammy Triplett, PA-C   BP 133/85 mmHg  Pulse 79  Temp(Src) 98.2 F (36.8 C) (Oral)  Resp 20  Ht 4' 11.5" (1.511 m)  Wt 78.983 kg  BMI 34.59 kg/m2  SpO2 100% Physical Exam  Constitutional: She appears well-developed and well-nourished.  Appears uncomfortable.  HENT:  Head: Normocephalic and atraumatic.  Eyes: Conjunctivae are normal.  Neck: Normal range of motion.  Cardiovascular: Normal rate, regular rhythm, normal heart sounds and intact distal pulses.   Pulmonary/Chest: Effort normal and breath sounds normal. She has no wheezes.  Abdominal: Soft. Bowel sounds are normal. There is tenderness in the right lower quadrant. There is no guarding and no CVA tenderness.  Genitourinary: Vagina normal and uterus  normal. Uterus is not enlarged and not tender. Cervix exhibits no motion tenderness and no discharge. Right adnexum displays tenderness and fullness. No tenderness in the vagina. No vaginal discharge found.  Musculoskeletal: Normal range of motion.  Lymphadenopathy:       Right: No inguinal adenopathy present.       Left: No inguinal adenopathy present.  Neurological: She is alert.  Skin: Skin is warm and dry.  Psychiatric: She has a normal mood and affect.  Nursing note and vitals reviewed.   ED Course  Procedures (including critical care time) Labs Review Labs Reviewed  WET PREP, GENITAL - Abnormal; Notable for the following:    Clue Cells Wet Prep HPF POC PRESENT (*)    All other components within normal limits  COMPREHENSIVE METABOLIC PANEL - Abnormal; Notable for the following:    Calcium 8.5 (*)    AST 12 (*)    ALT 13 (*)    All other components within normal limits  URINALYSIS, ROUTINE W REFLEX MICROSCOPIC (NOT AT [facility])  PREGNANCY, URINE  CBC WITH DIFFERENTIAL/PLATELET  LIPASE, BLOOD  GC/CHLAMYDIA PROBE AMP ([place]) NOT AT [facility]    Imaging Review US Transvaginal Non-ob  07/31/2015  CLINICAL DATA:  Sudden onset right lower quadrant pain for 3 hours EXAM: TRANSABDOMINAL AND TRANSVAGINAL ULTRASOUND OF PELVIS DOPPLER ULTRASOUND OF OVARIES TECHNIQUE: Both transabdominal and transvaginal ultrasound examinations of the pelvis were performed. Transabdominal technique was performed for global imaging of the pelvis including uterus, ovaries, adnexal regions, and pelvic cul-de-sac. It was necessary to proceed with endovaginal exam following the transabdominal exam to visualize the endometrium and ovaries. Color and duplex Doppler ultrasound was utilized to evaluate blood flow to the ovaries. COMPARISON:  None. FINDINGS: Uterus Measurements: 7 x 4 x 5 cm. No fibroids or other mass visualized. Endometrium Thickness: 2 mm.  No focal abnormality visualized. Right ovary Measurements: 41 x  32 x 23 mm. Normal appearance/no adnexal mass. Left ovary Measurements: 29 x 15 x 19 mm. Normal appearance/no adnexal mass. Pulsed Doppler evaluation of both ovaries demonstrates normal low-resistance arterial and venous waveforms. Other findings No abnormal free fluid. IMPRESSION: Negative pelvic ultrasound. Electronically Signed   By: Marnee Spring M.D.   On: 07/31/2015 01:06   US Pelvis Complete  07/31/2015  CLINICAL DATA:  Sudden onset right lower quadrant pain for 3 hours EXAM: TRANSABDOMINAL AND TRANSVAGINAL ULTRASOUND OF PELVIS DOPPLER ULTRASOUND OF OVARIES TECHNIQUE: Both transabdominal and transvaginal ultrasound examinations of the pelvis were performed. Transabdominal technique was performed for global imaging of the pelvis including uterus, ovaries, adnexal regions, and pelvic cul-de-sac. It was necessary to proceed with endovaginal exam following the transabdominal exam to visualize the endometrium and ovaries. Color and duplex Doppler ultrasound was utilized to evaluate blood flow to the ovaries. COMPARISON:  None. FINDINGS: Uterus Measurements: 7 x 4 x 5 cm. No fibroids or other mass visualized. Endometrium Thickness: 2 mm.  No focal abnormality visualized.  Right ovary Measurements: 41 x 32 x 23 mm. Normal appearance/no adnexal mass. Left ovary Measurements: 29 x 15 x 19 mm. Normal appearance/no adnexal mass. Pulsed Doppler evaluation of both ovaries demonstrates normal low-resistance arterial and venous waveforms. Other findings No abnormal free fluid. IMPRESSION: Negative pelvic ultrasound. Electronically Signed   By: Marnee SpringJonathon  Watts M.D.   On: 07/31/2015 01:06   [place] Art/ven Flow Abd Pelv Doppler  07/31/2015  CLINICAL DATA:  Sudden onset right lower quadrant pain for 3 hours EXAM: TRANSABDOMINAL AND TRANSVAGINAL ULTRASOUND OF PELVIS DOPPLER ULTRASOUND OF OVARIES TECHNIQUE: Both transabdominal and transvaginal ultrasound examinations of the pelvis were performed. Transabdominal technique was  performed for global imaging of the pelvis including uterus, ovaries, adnexal regions, and pelvic cul-de-sac. It was necessary to proceed with endovaginal exam following the transabdominal exam to visualize the endometrium and ovaries. Color and duplex Doppler ultrasound was utilized to evaluate blood flow to the ovaries. COMPARISON:  None. FINDINGS: Uterus Measurements: 7 x 4 x 5 cm. No fibroids or other mass visualized. Endometrium Thickness: 2 mm.  No focal abnormality visualized. Right ovary Measurements: 41 x 32 x 23 mm. Normal appearance/no adnexal mass. Left ovary Measurements: 29 x 15 x 19 mm. Normal appearance/no adnexal mass. Pulsed Doppler evaluation of both ovaries demonstrates normal low-resistance arterial and venous waveforms. Other findings No abnormal free fluid. IMPRESSION: Negative pelvic ultrasound. Electronically Signed   By: Marnee SpringJonathon  Watts M.D.   On: 07/31/2015 01:06   I have personally reviewed and evaluated these images and lab results as part of my medical decision-making.   EKG Interpretation None      MDM   Final diagnoses:  Pain    US negative for ovarian torsion.  Re-exam pt still painful after morphine, ttp rlq and low right pelvis. Dilaudid ordered.  Will Ct to r/o appy, although sudden onset sx makes this less likely.  Discussed with Dr Blinda LeatherwoodPollina who takes over care.      Burgess AmorJulie Morse Brueggemann, PA-C 07/31/15 0122  Gilda Creasehristopher J Pollina, MD 07/31/15 [phone]

## 2015-07-31 NOTE — Discharge Instructions (Signed)

## 2015-07-31 NOTE — ED Notes (Signed)
Pt gone to US

## 2015-08-03 LAB — GC/CHLAMYDIA PROBE AMP (~~LOC~~) NOT AT ARMC
CHLAMYDIA, DNA PROBE: NEGATIVE
Neisseria Gonorrhea: NEGATIVE

## 2015-08-05 MED FILL — Oxycodone w/ Acetaminophen Tab 5-325 MG: ORAL | Qty: 6 | Status: AC

## 2015-08-06 ENCOUNTER — Encounter: Payer: Self-pay | Admitting: Physician Assistant

## 2015-10-02 ENCOUNTER — Ambulatory Visit (INDEPENDENT_AMBULATORY_CARE_PROVIDER_SITE_OTHER): Payer: BLUE CROSS/BLUE SHIELD | Admitting: *Deleted

## 2015-10-02 ENCOUNTER — Other Ambulatory Visit: Payer: Self-pay | Admitting: Adult Health

## 2015-10-02 DIAGNOSIS — Z3202 Encounter for pregnancy test, result negative: Secondary | ICD-10-CM | POA: Diagnosis not present

## 2015-10-02 DIAGNOSIS — Z3042 Encounter for surveillance of injectable contraceptive: Secondary | ICD-10-CM

## 2015-10-02 LAB — POCT URINE PREGNANCY: PREG TEST UR: NEGATIVE

## 2015-10-02 MED ORDER — MEDROXYPROGESTERONE ACETATE 150 MG/ML IM SUSP: INTRAMUSCULAR | Status: DC

## 2015-10-02 MED ORDER — MEDROXYPROGESTERONE ACETATE 150 MG/ML IM SUSP
150.0000 mg | Freq: Once | INTRAMUSCULAR | Status: AC
  Administered 2015-10-02: 150 mg via INTRAMUSCULAR

## 2015-10-02 NOTE — Progress Notes (Signed)
Patient ID: Jennifer Gonzales, female   DOB: 07/15/1980, 35 y.o.   MRN: [id number] Pt given Depo Provera 150 mg IM left deltoid with no complications, negative pregnancy test. [facility]. Pt to return in 12 weeks for next injection.

## 2015-11-23 ENCOUNTER — Emergency Department (HOSPITAL_COMMUNITY)
Admission: EM | Admit: 2015-11-23 | Discharge: 2015-11-23 | Disposition: A | Discharge status: Home / Self Care | Payer: BLUE CROSS/BLUE SHIELD | Attending: Emergency Medicine | Admitting: Emergency Medicine

## 2015-11-23 ENCOUNTER — Encounter (HOSPITAL_COMMUNITY): Payer: Self-pay | Admitting: Emergency Medicine

## 2015-11-23 DIAGNOSIS — Z79899 Other long term (current) drug therapy: Secondary | ICD-10-CM | POA: Diagnosis not present

## 2015-11-23 DIAGNOSIS — F17210 Nicotine dependence, cigarettes, uncomplicated: Secondary | ICD-10-CM | POA: Insufficient documentation

## 2015-11-23 DIAGNOSIS — R51 Headache: Secondary | ICD-10-CM | POA: Diagnosis present

## 2015-11-23 DIAGNOSIS — R519 Headache, unspecified: Secondary | ICD-10-CM

## 2015-11-23 LAB — CBG MONITORING, ED: Glucose-Capillary: 119 mg/dL — ABNORMAL HIGH (ref 65–99)

## 2015-11-23 MED ORDER — HYDROCODONE-ACETAMINOPHEN 5-325 MG PO TABS: 1.0000 | ORAL_TABLET | Freq: Four times a day (QID) | ORAL | Status: DC | PRN

## 2015-11-23 MED ORDER — HYDROMORPHONE HCL 1 MG/ML IJ SOLN: 1.0000 mg | Freq: Once | INTRAMUSCULAR | Status: DC

## 2015-11-23 MED ORDER — HYDROMORPHONE HCL 1 MG/ML IJ SOLN
1.0000 mg | Freq: Once | INTRAMUSCULAR | Status: AC
  Administered 2015-11-23: 1 mg via INTRAMUSCULAR
  Filled 2015-11-23: qty 1

## 2015-11-23 MED ORDER — ONDANSETRON 4 MG PO TBDP
4.0000 mg | ORAL_TABLET | Freq: Once | ORAL | Status: AC
  Administered 2015-11-23: 4 mg via ORAL
  Filled 2015-11-23: qty 1

## 2015-11-23 MED ORDER — ONDANSETRON HCL 4 MG/2ML IJ SOLN: 4.0000 mg | Freq: Once | INTRAMUSCULAR | Status: DC

## 2015-11-23 NOTE — ED Notes (Signed)
Pt states she has had a bad headache since last Saturday.

## 2015-11-23 NOTE — ED Notes (Signed)
Sister at bedside.

## 2015-11-23 NOTE — ED Notes (Signed)
Pt made aware to return if symptoms worsen or if any life threatening symptoms occur.   

## 2015-11-23 NOTE — ED Provider Notes (Signed)
CSN: [id number]     Arrival date & time 11/23/15  1422 History   First MD Initiated Contact with Patient 11/23/15 1600     Chief Complaint  Patient presents with  . Headache     (Consider location/radiation/quality/duration/timing/severity/associated sxs/prior Treatment) Patient is a 35 y.o. female presenting with headaches. The history is provided by the patient (Patient complains of a headache. No fever).  Headache Pain location:  Generalized Quality:  Dull Radiates to:  Does not radiate Severity currently:  8/10 Severity at highest:  9/10 Onset quality:  Sudden Timing:  Constant Progression:  Worsening Chronicity:  Recurrent Similar to prior headaches: yes   Associated symptoms: no abdominal pain, no back pain, no congestion, no cough, no diarrhea, no fatigue, no seizures and no sinus pressure     Past Medical History  Diagnosis Date  . Fibromyalgia   . Back pain   . Mental disorder   . Hx of migraines   . BV (bacterial vaginosis)    Past Surgical History  Procedure Laterality Date  . Cholecystectomy    . Back surgery    . Tonsillectomy    . Cesarean section     Family History  Problem Relation Age of Onset  . Hypertension Other   . Diabetes Other   . Stroke Other   . Heart disease Other   . Cancer Paternal Grandmother     breast   Social History  Substance Use Topics  . Smoking status: Heavy Tobacco Smoker -- 0.50 packs/day for 23 years    Types: Cigarettes  . Smokeless tobacco: Never Used  . Alcohol Use: No   OB History    Gravida Para Term Preterm AB TAB SAB Ectopic Multiple Living   Review of Systems  Constitutional: Negative for appetite change and fatigue.  HENT: Negative for congestion, ear discharge and sinus pressure.   Eyes: Negative for discharge.  Respiratory: Negative for cough.   Cardiovascular: Negative for chest pain.  Gastrointestinal: Negative for abdominal pain and diarrhea.  Genitourinary: Negative for  frequency and hematuria.  Musculoskeletal: Negative for back pain.  Skin: Negative for rash.  Neurological: Positive for headaches. Negative for seizures.  Psychiatric/Behavioral: Negative for hallucinations.      Allergies  Ketorolac tromethamine  Home Medications   Prior to Admission medications   Medication Sig Start Date End Date Taking? Authorizing Provider  buPROPion (WELLBUTRIN SR) 150 MG 12 hr tablet Take 150 mg by mouth 2 (two) times daily. 10/29/15  Yes Historical Provider, MD  cetirizine (ZYRTEC) 10 MG tablet Take 10 mg by mouth daily as needed for allergies.   Yes Historical Provider, MD  medroxyPROGESTERone (DEPO-PROVERA) 150 MG/ML injection use as directed at physician's office every 3 months 10/02/15  Yes Adline Potter, NP  HYDROcodone-acetaminophen (NORCO/VICODIN) 5-325 MG tablet Take 1 tablet by mouth every 6 (six) hours as needed for moderate pain. 11/23/15   Bethann Berkshire, MD   BP 122/84 mmHg  Pulse 66  Temp(Src) 99 F (37.2 C) (Temporal)  Resp 16  Ht  (1.626 m)  Wt 175 lb (79.379 kg)  BMI 30.02 kg/m2  SpO2 99% Physical Exam  Constitutional: She is oriented to person, place, and time. She appears well-developed.  HENT:  Head: Normocephalic.  Eyes: Conjunctivae and EOM are normal. No scleral icterus.  Neck: Neck supple. No thyromegaly present.  Cardiovascular: Normal rate and regular rhythm.  Exam reveals no gallop  and no friction rub.   No murmur heard. Pulmonary/Chest: No stridor. She has no wheezes. She has no rales. She exhibits no tenderness.  Abdominal: She exhibits no distension. There is no tenderness. There is no rebound.  Musculoskeletal: Normal range of motion. She exhibits no edema.  Lymphadenopathy:    She has no cervical adenopathy.  Neurological: She is oriented to person, place, and time. She exhibits normal muscle tone. Coordination normal.  Skin: No rash noted. No erythema.  Psychiatric: She has a normal mood and affect. Her  behavior is normal.    ED Course  Procedures (including critical care time) Labs Review Labs Reviewed  CBG MONITORING, ED - Abnormal; Notable for the following:    Glucose-Capillary 119 (*)    All other components within normal limits    Imaging Review No results found. I have personally reviewed and evaluated these images and lab results as part of my medical decision-making.   EKG Interpretation None      MDM   Final diagnoses:  Headache behind the eyes    Patient improved with pain medicine. Suspect chest tension headache. Patient will follow-up with her PCP    Bethann BerkshireJoseph Layla Kesling, MD 11/23/15 [phone]

## 2015-11-23 NOTE — ED Notes (Addendum)
Charted on wrong patient

## 2015-11-23 NOTE — Discharge Instructions (Signed)
Follow up with your md as planned °

## 2015-12-06 ENCOUNTER — Encounter (HOSPITAL_COMMUNITY): Payer: Self-pay | Admitting: *Deleted

## 2015-12-06 ENCOUNTER — Emergency Department (HOSPITAL_COMMUNITY)
Admission: EM | Admit: 2015-12-06 | Discharge: 2015-12-07 | Disposition: A | Discharge status: Home / Self Care | Payer: BLUE CROSS/BLUE SHIELD | Attending: Emergency Medicine | Admitting: Emergency Medicine

## 2015-12-06 DIAGNOSIS — Z79899 Other long term (current) drug therapy: Secondary | ICD-10-CM | POA: Diagnosis not present

## 2015-12-06 DIAGNOSIS — H6501 Acute serous otitis media, right ear: Secondary | ICD-10-CM | POA: Diagnosis not present

## 2015-12-06 DIAGNOSIS — H9201 Otalgia, right ear: Secondary | ICD-10-CM | POA: Diagnosis present

## 2015-12-06 DIAGNOSIS — F17210 Nicotine dependence, cigarettes, uncomplicated: Secondary | ICD-10-CM | POA: Insufficient documentation

## 2015-12-06 MED ORDER — AMOXICILLIN 250 MG PO CAPS
500.0000 mg | ORAL_CAPSULE | Freq: Once | ORAL | Status: AC
  Administered 2015-12-06: 500 mg via ORAL
  Filled 2015-12-06: qty 2

## 2015-12-06 MED ORDER — IBUPROFEN 800 MG PO TABS
800.0000 mg | ORAL_TABLET | Freq: Once | ORAL | Status: AC
  Administered 2015-12-06: 800 mg via ORAL
  Filled 2015-12-06: qty 1

## 2015-12-06 NOTE — ED Provider Notes (Signed)
TIME SEEN: 11:45 PM 12/06/2015  CHIEF COMPLAINT:  Chief Complaint  Patient presents with  . Otalgia    HPI:  HPI Comments: Jennifer Gonzales is a 35 y.o. female who presents to the Emergency Department complaining of sudden onset, constant right ear pain radiating to her the right side of her neck and throat onset two days ago. Pt also has associated chills. She denies swimming recently.She is not a diabetic. No alleviating factors noted. Pt denies fever, cough, vomiting, and diarrhea. Denies sore throat, dental pain.   ROS: See HPI Constitutional: no fever  Eyes: no drainage  ENT: no runny nose   Cardiovascular:  no chest pain  Resp: no SOB  GI: no vomiting GU: no dysuria Integumentary: no rash  Allergy: no hives  Musculoskeletal: no leg swelling  Neurological: no slurred speech ROS otherwise negative  PAST MEDICAL HISTORY/PAST SURGICAL HISTORY:  Past Medical History:  Diagnosis Date  . Back pain   . BV (bacterial vaginosis)   . Fibromyalgia   . Hx of migraines   . Mental disorder     MEDICATIONS:  Prior to Admission medications   Medication Sig Start Date End Date Taking? Authorizing Provider  buPROPion (WELLBUTRIN SR) 150 MG 12 hr tablet Take 150 mg by mouth 2 (two) times daily. 10/29/15  Yes Historical Provider, MD  cetirizine (ZYRTEC) 10 MG tablet Take 10 mg by mouth daily as needed for allergies.   Yes Historical Provider, MD  medroxyPROGESTERone (DEPO-PROVERA) 150 MG/ML injection use as directed at physician's office every 3 months 10/02/15  Yes Adline Potter, NP  SUMAtriptan (IMITREX) 25 MG tablet Take 25 mg by mouth once. May repeat in 2 hours if headache persists or recurs.   Yes Historical Provider, MD  HYDROcodone-acetaminophen (NORCO/VICODIN) 5-325 MG tablet Take 1 tablet by mouth every 6 (six) hours as needed for moderate pain. 11/23/15   Bethann Berkshire, MD    ALLERGIES:  Allergies  Allergen Reactions  . Ketorolac Tromethamine Other (See Comments)     REACTION: Hallucinations, alters mental status    SOCIAL HISTORY:  Social History  Substance Use Topics  . Smoking status: Heavy Tobacco Smoker    Packs/day: 0.50    Years: 23.00    Types: Cigarettes  . Smokeless tobacco: Never Used  . Alcohol use No    FAMILY HISTORY: Family History  Problem Relation Age of Onset  . Hypertension Other   . Diabetes Other   . Stroke Other   . Heart disease Other   . Cancer Paternal Grandmother     breast    EXAM: BP 141/95   Pulse 97   Temp 99.6 F (37.6 C) (Oral)   Resp 20   Wt 180 lb (81.6 kg)   SpO2 100%   BMI 30.90 kg/m  CONSTITUTIONAL: Alert and oriented and responds appropriately to questions. Well-appearing; well-nourished HEAD: Normocephalic EYES: Conjunctivae clear, PERRL ENT: normal nose; no rhinorrhea; moist mucous membranes; No pharyngeal erythema or petechiae, no tonsillar hypertrophy or exudate, no uvular deviation, no trismus or drooling, normal phonation, no stridor, no dental caries or abscess noted, no Ludwig's angina, tongue sits flat in the bottom of the mouth, TM on the left is clear without erythema, purulence, bulging, perforation, effusion.  Right TM is erythematous, bulging with associated effusion. No perforation. No cerumen impaction or sign of foreign body in the external auditory canal bialterally. No inflammation, erythema or drainage from the external auditory canal. No signs of mastoiditis. No pain with manipulation of  the pinna bilaterally. NECK: Supple, no meningismus, no LAD  CARD: RRR; S1 and S2 appreciated; no murmurs, no clicks, no rubs, no gallops RESP: Normal chest excursion without splinting or tachypnea; breath sounds clear and equal bilaterally; no wheezes, no rhonchi, no rales, no hypoxia or respiratory distress, speaking full sentences ABD/GI: Normal bowel sounds; non-distended; soft, non-tender, no rebound, no guarding, no peritoneal signs BACK:  The back appears normal and is non-tender to  palpation, there is no CVA tenderness EXT: Normal ROM in all joints; non-tender to palpation; no edema; normal capillary refill; no cyanosis, no calf tenderness or swelling    SKIN: Normal color for age and race; warm; no rash NEURO: Moves all extremities equally, sensation to light touch intact diffusely, cranial nerves II through XII intact PSYCH: The patient's mood and manner are appropriate. Grooming and personal hygiene are appropriate.  MEDICAL DECISION MAKING: Patient here with right-sided otitis media. Will discharge on amoxicillin. Otherwise well-appearing. No sign of meningitis, deep space neck infection, peritonsillar abscess, Ludwig's angina. She has a PCP for follow-up. Recommended alternating Tylenol and Motrin.   At this time, I do not feel there is any life-threatening condition present. I have reviewed and discussed all results (EKG, imaging, lab, urine as appropriate), exam findings with patient. I have reviewed nursing notes and appropriate previous records.  I feel the patient is safe to be discharged home without further emergent workup. Discussed usual and customary return precautions. Patient and family (if present) verbalize understanding and are comfortable with this plan.  Patient will follow-up with their primary care provider. If they do not have a primary care provider, information for follow-up has been provided to them. All questions have been answered.  I personally performed the services described in this documentation, which was scribed in my presence. The recorded information has been reviewed and is accurate.      Layla Maw Ward, DO 12/07/15 0121

## 2015-12-06 NOTE — ED Triage Notes (Addendum)
Pt c/o right ear pain that started Friday with chills,

## 2015-12-07 MED ORDER — AMOXICILLIN 500 MG PO CAPS: 500.0000 mg | ORAL_CAPSULE | Freq: Three times a day (TID) | ORAL | 0 refills | Status: DC

## 2015-12-07 MED ORDER — IBUPROFEN 800 MG PO TABS: 800.0000 mg | ORAL_TABLET | Freq: Three times a day (TID) | ORAL | 0 refills | Status: DC | PRN

## 2015-12-07 NOTE — ED Notes (Signed)
Pt alert & oriented x4, stable gait. Patient  given discharge instructions, paperwork & prescription(s). Patient verbalized understanding. Pt left department w/ no further questions. 

## 2015-12-25 ENCOUNTER — Ambulatory Visit: Payer: BLUE CROSS/BLUE SHIELD

## 2015-12-25 ENCOUNTER — Ambulatory Visit (INDEPENDENT_AMBULATORY_CARE_PROVIDER_SITE_OTHER): Payer: BLUE CROSS/BLUE SHIELD | Admitting: *Deleted

## 2015-12-25 ENCOUNTER — Encounter: Payer: Self-pay | Admitting: *Deleted

## 2015-12-25 DIAGNOSIS — Z3042 Encounter for surveillance of injectable contraceptive: Secondary | ICD-10-CM

## 2015-12-25 DIAGNOSIS — Z3202 Encounter for pregnancy test, result negative: Secondary | ICD-10-CM | POA: Diagnosis not present

## 2015-12-25 LAB — POCT URINE PREGNANCY: PREG TEST UR: NEGATIVE

## 2015-12-25 MED ORDER — MEDROXYPROGESTERONE ACETATE 150 MG/ML IM SUSP
150.0000 mg | Freq: Once | INTRAMUSCULAR | Status: AC
  Administered 2015-12-25: 150 mg via INTRAMUSCULAR

## 2015-12-25 NOTE — Progress Notes (Signed)
Pt here for Depo. Pt tolerated shot well. Return in 12 weeks for next shot. JSY 

## 2016-03-18 ENCOUNTER — Ambulatory Visit (INDEPENDENT_AMBULATORY_CARE_PROVIDER_SITE_OTHER): Payer: BLUE CROSS/BLUE SHIELD | Admitting: *Deleted

## 2016-03-18 ENCOUNTER — Encounter: Payer: Self-pay | Admitting: *Deleted

## 2016-03-18 DIAGNOSIS — Z3202 Encounter for pregnancy test, result negative: Secondary | ICD-10-CM

## 2016-03-18 DIAGNOSIS — Z3042 Encounter for surveillance of injectable contraceptive: Secondary | ICD-10-CM

## 2016-03-18 DIAGNOSIS — Z308 Encounter for other contraceptive management: Secondary | ICD-10-CM

## 2016-03-18 LAB — POCT URINE PREGNANCY: Preg Test, Ur: NEGATIVE

## 2016-03-18 MED ORDER — MEDROXYPROGESTERONE ACETATE 150 MG/ML IM SUSP
150.0000 mg | Freq: Once | INTRAMUSCULAR | Status: AC
  Administered 2016-03-18: 150 mg via INTRAMUSCULAR

## 2016-03-18 NOTE — Progress Notes (Signed)
Depo Provera 150 mg IM injection given right deltoid with no complications, neg pregnancy test. Pt to return in 12 weeks for next injection.

## 2016-04-12 ENCOUNTER — Emergency Department (HOSPITAL_COMMUNITY)
Admission: EM | Admit: 2016-04-12 | Discharge: 2016-04-12 | Disposition: A | Discharge status: Home / Self Care | Payer: BLUE CROSS/BLUE SHIELD | Attending: Emergency Medicine | Admitting: Emergency Medicine

## 2016-04-12 ENCOUNTER — Encounter (HOSPITAL_COMMUNITY): Payer: Self-pay | Admitting: Emergency Medicine

## 2016-04-12 DIAGNOSIS — F17210 Nicotine dependence, cigarettes, uncomplicated: Secondary | ICD-10-CM | POA: Insufficient documentation

## 2016-04-12 DIAGNOSIS — Z791 Long term (current) use of non-steroidal anti-inflammatories (NSAID): Secondary | ICD-10-CM | POA: Diagnosis not present

## 2016-04-12 DIAGNOSIS — G43909 Migraine, unspecified, not intractable, without status migrainosus: Secondary | ICD-10-CM | POA: Diagnosis present

## 2016-04-12 DIAGNOSIS — G43109 Migraine with aura, not intractable, without status migrainosus: Secondary | ICD-10-CM | POA: Insufficient documentation

## 2016-04-12 MED ORDER — PROCHLORPERAZINE EDISYLATE 5 MG/ML IJ SOLN
10.0000 mg | Freq: Once | INTRAMUSCULAR | Status: AC
  Administered 2016-04-12: 10 mg via INTRAMUSCULAR
  Filled 2016-04-12: qty 2

## 2016-04-12 MED ORDER — DEXAMETHASONE SODIUM PHOSPHATE 10 MG/ML IJ SOLN
10.0000 mg | Freq: Once | INTRAMUSCULAR | Status: AC
  Administered 2016-04-12: 10 mg via INTRAMUSCULAR
  Filled 2016-04-12: qty 1

## 2016-04-12 MED ORDER — HYDROMORPHONE HCL 1 MG/ML IJ SOLN
0.5000 mg | Freq: Once | INTRAMUSCULAR | Status: AC
  Administered 2016-04-12: 0.5 mg via INTRAMUSCULAR
  Filled 2016-04-12: qty 1

## 2016-04-12 MED ORDER — DIPHENHYDRAMINE HCL 50 MG/ML IJ SOLN
25.0000 mg | Freq: Once | INTRAMUSCULAR | Status: AC
  Administered 2016-04-12: 25 mg via INTRAMUSCULAR
  Filled 2016-04-12: qty 1

## 2016-04-12 NOTE — ED Triage Notes (Signed)
Pt reports migraine since last Tuesday. Pt reports using all of her home meds without relief. Pt has nausea but no emesis. Pt reports dizziness when she stands.

## 2016-04-12 NOTE — ED Provider Notes (Signed)
[facility]-EM[facility]Y DEPT Provider Note   CSN: [id number] Arrival date & time: 04/12/16  1447     History   Chief Complaint Chief Complaint  Patient presents with  . Migraine    HPI Jennifer Gonzales is a 35 y.o. female presenting with a one week history of  migraine headache which started gradually one week ago.  The patient has a history of migraine and the current symptoms are similar to previous episodes of migraine headache.  The patients symptoms were not preceded by prodromal symptoms.  The patient has bilateral pressure behind her eyes in association with photo and phonophobia, nausea without emesis and mild lightheadedness when standing.  There has been no fevers, chills, neck pain or stiffness, syncope, confusion or localized weakness. She endorses being able to hydrate and does not believe she is dehydrated, stating she has been urinating normally and her urine is clear.  She has tried hydrocodone and oxycodone, medicines left over from an orthopedic surgery and also took some left over sumatriptan from her last migraine (11/2015) without relief. .  The history is provided by the patient.    Past Medical History:  Diagnosis Date  . Back pain   . BV (bacterial vaginosis)   . Fibromyalgia   . Hx of migraines   . Mental disorder     Patient Active Problem List   Diagnosis Date Noted  . Hyperlipemia 04/05/2015  . Esophageal reflux 04/05/2015  . Obesity, unspecified 04/05/2015  . Hx of migraines 07/28/2012  . Depression 07/28/2012  . Back pain 07/28/2012  . Fibromyalgia 07/28/2012  . BV (bacterial vaginosis) 07/28/2012    Past Surgical History:  Procedure Laterality Date  . BACK SURGERY    . CESAREAN SECTION    . CHOLECYSTECTOMY    . TONSILLECTOMY      OB History    Gravida Para Term Preterm AB Living   2 2       2    SAB TAB Ectopic Multiple Live Births           2       Home Medications    Prior to Admission medications   Medication Sig Start Date End Date  Taking? Authorizing Provider  cetirizine (ZYRTEC) 10 MG tablet Take 10 mg by mouth daily as needed for allergies.    Historical Provider, MD  ibuprofen (ADVIL,MOTRIN) 200 MG tablet Take 800 mg by mouth as needed.    Historical Provider, MD  medroxyPROGESTERone (DEPO-PROVERA) 150 MG/ML injection use as directed at physician's office every 3 months 10/02/15   Adline PotterJennifer A Griffin, NP  nicotine polacrilex (EQ NICOTINE) 4 MG gum Take 4 mg by mouth as needed for smoking cessation.    Historical Provider, MD  omeprazole (PRILOSEC) 40 MG capsule Take 40 mg by mouth daily. 11/27/15   Historical Provider, MD    Family History Family History  Problem Relation Age of Onset  . Hypertension Other   . Diabetes Other   . Stroke Other   . Heart disease Other   . Cancer Paternal Grandmother     breast    Social History Social History  Substance Use Topics  . Smoking status: Current Some Day Smoker    Packs/day: 0.00    Years: 23.00    Types: Cigarettes  . Smokeless tobacco: Never Used     Comment: smokes 2-4 cig daily  . Alcohol use No     Allergies   Ketorolac tromethamine and Sulfa antibiotics   Review of Systems  Review of Systems  Constitutional: Negative for fever.  HENT: Negative for congestion and sore throat.   Eyes: Positive for photophobia.  Respiratory: Negative for chest tightness and shortness of breath.   Cardiovascular: Negative for chest pain.  Gastrointestinal: Positive for nausea. Negative for abdominal pain and vomiting.  Genitourinary: Negative.   Musculoskeletal: Negative for arthralgias, joint swelling and neck pain.  Skin: Negative.  Negative for rash and wound.  Neurological: Positive for light-headedness and headaches. Negative for dizziness, speech difficulty, weakness and numbness.  Psychiatric/Behavioral: Negative.      Physical Exam Updated Vital Signs BP 132/93   Pulse 88   Temp 98.1 F (36.7 C) (Oral)   Resp 18   Ht 4' 11.5" (1.511 m)   Wt 81 kg    SpO2 100%   BMI 35.46 kg/m   Physical Exam  Constitutional: She is oriented to person, place, and time. She appears well-developed and well-nourished.  Uncomfortable appearing  HENT:  Head: Normocephalic and atraumatic.  Mouth/Throat: Oropharynx is clear and moist.  Eyes: EOM are normal. Pupils are equal, round, and reactive to light.  Neck: Normal range of motion. Neck supple.  Cardiovascular: Normal rate and normal heart sounds.   Pulmonary/Chest: Effort normal.  Abdominal: Soft. There is no tenderness.  Musculoskeletal: Normal range of motion.  Lymphadenopathy:    She has no cervical adenopathy.  Neurological: She is alert and oriented to person, place, and time. She has normal strength. No sensory deficit. Gait normal. GCS eye subscore is 4. GCS verbal subscore is 5. GCS motor subscore is 6.  Normal heel-shin, normal rapid alternating movements. Cranial nerves III-XII intact.  No pronator drift.  Skin: Skin is warm and dry. No rash noted.  Psychiatric: She has a normal mood and affect. Her speech is normal and behavior is normal. Thought content normal. Cognition and memory are normal.  Nursing note and vitals reviewed.    ED Treatments / Results  Labs (all labs ordered are listed, but only abnormal results are displayed) Labs Reviewed - No data to display  EKG  EKG Interpretation None       Radiology No results found.  Procedures Procedures (including critical care time)  Medications Ordered in ED Medications  prochlorperazine (COMPAZINE) injection 10 mg (10 mg Intramuscular Given 04/12/16 1646)  diphenhydrAMINE (BENADRYL) injection 25 mg (25 mg Intramuscular Given 04/12/16 1647)  dexamethasone (DECADRON) injection 10 mg (10 mg Intramuscular Given 04/12/16 1647)  HYDROmorphone (DILAUDID) injection 0.5 mg (0.5 mg Intramuscular Given 04/12/16 1736)     Initial Impression / Assessment and Plan / ED Course  I have reviewed the triage vital signs and the nursing  notes.  Pertinent labs & imaging results that were available during my care of the patient were reviewed by me and considered in my medical decision making (see chart for details).  Clinical Course     Pt given migraine cocktail, nausea resolved, headache now 6/10 but less photophobia, feels more comfortable.  Dilaudid 0.5 mg IM given.  Feeling improved,  Friend will be driving pt home.    Recheck prior to dc, exam still nonfocal.  Typical migraine headache.  Advised to go home and sleep, expect headache to be gone once she wakes.  The patient appears reasonably screened and/or stabilized for discharge and I doubt any other medical condition or other [facility] requiring further screening, evaluation, or treatment in the ED at this time prior to discharge.   Final Clinical Impressions(s) / ED Diagnoses   Final diagnoses:  Migraine with aura and without status migrainosus, not intractable    New Prescriptions New Prescriptions   No medications on file     Burgess AmorJulie Ionia Schey, PA-C 04/12/16 1758    Margarita Grizzleanielle Ray, MD 04/12/16 2056

## 2016-04-12 NOTE — Discharge Instructions (Signed)
You have received a narcotic injection today.  Do not drive within the next 4 hours.

## 2016-04-24 ENCOUNTER — Emergency Department (HOSPITAL_COMMUNITY): Payer: BLUE CROSS/BLUE SHIELD

## 2016-04-24 ENCOUNTER — Encounter (HOSPITAL_COMMUNITY): Payer: Self-pay | Admitting: Emergency Medicine

## 2016-04-24 ENCOUNTER — Emergency Department (HOSPITAL_COMMUNITY)
Admission: EM | Admit: 2016-04-24 | Discharge: 2016-04-25 | Disposition: A | Discharge status: Home / Self Care | Payer: BLUE CROSS/BLUE SHIELD | Attending: Emergency Medicine | Admitting: Emergency Medicine

## 2016-04-24 DIAGNOSIS — R202 Paresthesia of skin: Secondary | ICD-10-CM | POA: Insufficient documentation

## 2016-04-24 DIAGNOSIS — Z791 Long term (current) use of non-steroidal anti-inflammatories (NSAID): Secondary | ICD-10-CM | POA: Insufficient documentation

## 2016-04-24 DIAGNOSIS — R0789 Other chest pain: Secondary | ICD-10-CM | POA: Diagnosis present

## 2016-04-24 DIAGNOSIS — R002 Palpitations: Secondary | ICD-10-CM | POA: Diagnosis not present

## 2016-04-24 DIAGNOSIS — F419 Anxiety disorder, unspecified: Secondary | ICD-10-CM | POA: Diagnosis not present

## 2016-04-24 DIAGNOSIS — F17210 Nicotine dependence, cigarettes, uncomplicated: Secondary | ICD-10-CM | POA: Diagnosis not present

## 2016-04-24 LAB — D-DIMER, QUANTITATIVE (NOT AT ARMC): D DIMER QUANT: 0.35 ug{FEU}/mL (ref 0.00–0.50)

## 2016-04-24 LAB — URINALYSIS, ROUTINE W REFLEX MICROSCOPIC
Bilirubin Urine: NEGATIVE
GLUCOSE, UA: NEGATIVE mg/dL
Hgb urine dipstick: NEGATIVE
KETONES UR: 5 mg/dL — AB
LEUKOCYTES UA: NEGATIVE
NITRITE: NEGATIVE
PROTEIN: NEGATIVE mg/dL
Specific Gravity, Urine: 1.028 (ref 1.005–1.030)
pH: 5 (ref 5.0–8.0)

## 2016-04-24 LAB — CBC
HEMATOCRIT: 41.1 % (ref 36.0–46.0)
HEMOGLOBIN: 13.9 g/dL (ref 12.0–15.0)
MCH: 31.4 pg (ref 26.0–34.0)
MCHC: 33.8 g/dL (ref 30.0–36.0)
MCV: 92.8 fL (ref 78.0–100.0)
PLATELETS: 253 10*3/uL (ref 150–400)
RBC: 4.43 MIL/uL (ref 3.87–5.11)
RDW: 12.3 % (ref 11.5–15.5)
WBC: 11.6 10*3/uL — AB (ref 4.0–10.5)

## 2016-04-24 LAB — BASIC METABOLIC PANEL
ANION GAP: 7 (ref 5–15)
BUN: 14 mg/dL (ref 6–20)
CHLORIDE: 108 mmol/L (ref 101–111)
CO2: 24 mmol/L (ref 22–32)
Calcium: 9 mg/dL (ref 8.9–10.3)
Creatinine, Ser: 0.81 mg/dL (ref 0.44–1.00)
GFR calc non Af Amer: >60 mL/min (ref >60)
Glucose, Bld: 84 mg/dL (ref 65–99)
POTASSIUM: 3.5 mmol/L (ref 3.5–5.1)
SODIUM: 139 mmol/L (ref 135–145)

## 2016-04-24 LAB — PREGNANCY, URINE: PREG TEST UR: NEGATIVE

## 2016-04-24 LAB — I-STAT TROPONIN, ED
Troponin i, poc: 0 ng/mL (ref 0.00–0.08)
Troponin i, poc: 0 ng/mL (ref 0.00–0.08)

## 2016-04-24 NOTE — ED Notes (Signed)
Patient transported to CT 

## 2016-04-24 NOTE — ED Triage Notes (Signed)
Pt states she started having centralized chest pain with radiation to R arm that started around 1930 tonight. Pt also states that she is having pain from waist down with tingling to bilateral extremities.

## 2016-04-24 NOTE — ED Provider Notes (Signed)
[facility] Provider Note   CSN: [id number] Arrival date & time: 04/24/16  2044     History   Chief Complaint Chief Complaint  Patient presents with  . Chest Pain    HPI Jennifer Gonzales is a 35 y.o. female.   Chest Pain      Pt was seen at 2115. Per pt, c/o gradual onset and resolution of one episode of palpitations that occurred at 1730 PTA. Pt states she was standing at work when she began to feel "shaky, palpitations, chest pain, and heavy legs." Pt states she also felt that she "just wanted my mom" at that time. Pt states her symptoms improved "except for the pain behind my knees." Denies SOB/cough, no abd pain, no N/V/D, no back pain, no fevers, no focal motor weakness, no syncope/near syncope.   Past Medical History:  Diagnosis Date  . Back pain   . BV (bacterial vaginosis)   . Fibromyalgia   . Hx of migraines   . Mental disorder     Patient Active Problem List   Diagnosis Date Noted  . Hyperlipemia 04/05/2015  . Esophageal reflux 04/05/2015  . Obesity, unspecified 04/05/2015  . Hx of migraines 07/28/2012  . Depression 07/28/2012  . Back pain 07/28/2012  . Fibromyalgia 07/28/2012  . BV (bacterial vaginosis) 07/28/2012    Past Surgical History:  Procedure Laterality Date  . BACK SURGERY    . CESAREAN SECTION    . CHOLECYSTECTOMY    . TONSILLECTOMY      OB History    Gravida Para Term Preterm AB Living   2 2       2    SAB TAB Ectopic Multiple Live Births           2       Home Medications    Prior to Admission medications   Medication Sig Start Date End Date Taking? Authorizing Provider  cetirizine (ZYRTEC) 10 MG tablet Take 10 mg by mouth daily as needed for allergies.    Historical Provider, MD  ibuprofen (ADVIL,MOTRIN) 200 MG tablet Take 800 mg by mouth as needed.    Historical Provider, MD  medroxyPROGESTERone (DEPO-PROVERA) 150 MG/ML injection use as directed at physician's office every 3 months 10/02/15   Adline PotterJennifer A Griffin, NP    nicotine polacrilex (EQ NICOTINE) 4 MG gum Take 4 mg by mouth as needed for smoking cessation.    Historical Provider, MD  omeprazole (PRILOSEC) 40 MG capsule Take 40 mg by mouth daily. 11/27/15   Historical Provider, MD    Family History Family History  Problem Relation Age of Onset  . Hypertension Other   . Diabetes Other   . Stroke Other   . Heart disease Other   . Cancer Paternal Grandmother     breast    Social History Social History  Substance Use Topics  . Smoking status: Current Some Day Smoker    Packs/day: 0.00    Years: 23.00    Types: Cigarettes  . Smokeless tobacco: Never Used     Comment: smokes 2-4 cig daily  . Alcohol use No     Allergies   Ketorolac tromethamine and Sulfa antibiotics   Review of Systems Review of Systems  Cardiovascular: Positive for chest pain.  ROS: Statement: All systems negative except as marked or noted in the HPI; Constitutional: Negative for fever and chills. ; ; Eyes: Negative for eye pain, redness and discharge. ; ; ENMT: Negative for ear pain, hoarseness, nasal congestion, sinus  pressure and sore throat. ; ; Cardiovascular: +palpitations. Negative for diaphoresis, dyspnea and peripheral edema. ; ; Respiratory: Negative for cough, wheezing and stridor. ; ; Gastrointestinal: Negative for nausea, vomiting, diarrhea, abdominal pain, blood in stool, hematemesis, jaundice and rectal bleeding. . ; ; Genitourinary: Negative for dysuria, flank pain and hematuria. ; ; Musculoskeletal: +CP. Negative for back pain and neck pain. Negative for swelling and trauma.; ; Skin: Negative for pruritus, rash, abrasions, blisters, bruising and skin lesion.; ; Neuro: +"shaky," "heavy legs." Negative for headache, lightheadedness and neck stiffness. Negative for weakness, altered level of consciousness, altered mental status, extremity weakness, involuntary movement, seizure and syncope.      Physical Exam Updated Vital Signs BP 122/91   Pulse 86   Temp  98.3 F (36.8 C) (Oral)   Resp 19   Ht 4\' 11"  (1.499 m)   Wt 180 lb (81.6 kg)   SpO2 99%   BMI 36.36 kg/m   Physical Exam 2120: Physical examination:  Nursing notes reviewed; Vital signs and O2 SAT reviewed;  Constitutional: Well developed, Well nourished, Well hydrated, In no acute distress; Head:  Normocephalic, atraumatic; Eyes: EOMI, PERRL, No scleral icterus; ENMT: Mouth and pharynx normal, Mucous membranes moist; Neck: Supple, Full range of motion, No lymphadenopathy; Cardiovascular: Regular rate and rhythm, No murmur, rub, or gallop; Respiratory: Breath sounds clear & equal bilaterally, No rales, rhonchi, wheezes.  Speaking full sentences with ease, Normal respiratory effort/excursion; Chest: Nontender, Movement normal; Abdomen: Soft, Nontender, Nondistended, Normal bowel sounds; Genitourinary: No CVA tenderness; Extremities: Pulses normal, No tenderness, No edema, No calf tenderness, edema or asymmetry.; Neuro: AA&Ox3, Major CN grossly intact. Speech clear.  No facial droop.  No nystagmus. Grips equal. Strength 5/5 equal bilat UE's and LE's.  DTR 2/4 equal bilat UE's and LE's.  No gross sensory deficits.  Normal cerebellar testing bilat UE's (finger-nose) and LE's (heel-shin)..; Skin: Color normal, Warm, Dry.   ED Treatments / Results  Labs (all labs ordered are listed, but only abnormal results are displayed)   EKG  EKG Interpretation  Date/Time:  Sunday April 24 2016 20:56:05 EST Ventricular Rate:  94 PR Interval:    QRS Duration: 88 QT Interval:  349 QTC Calculation: 437 R Axis:   68 Text Interpretation:  Sinus rhythm When compared with ECG of 08/03/2014 No significant change was found Confirmed by [facility]  MD, Nicholos Johns [phone]) on 04/24/2016 9:44:22 PM       Radiology   Procedures Procedures (including critical care time)  Medications Ordered in ED Medications - No data to display   Initial Impression / Assessment and Plan / ED Course  I have reviewed the  triage vital signs and the nursing notes.  Pertinent labs & imaging results that were available during my care of the patient were reviewed by me and considered in my medical decision making (see chart for details).  MDM Reviewed: previous chart, nursing note and vitals Reviewed previous: labs and ECG Interpretation: labs, ECG, x-ray and CT scan    Results for orders placed or performed during the hospital encounter of 04/24/16  Basic metabolic panel  Result Value Ref Range   Sodium 139 135 - 145 mmol/L   Potassium 3.5 3.5 - 5.1 mmol/L   Chloride 108 101 - 111 mmol/L   CO2 24 22 - 32 mmol/L   Glucose, Bld 84 65 - 99 mg/dL   BUN 14 6 - 20 mg/dL   Creatinine, Ser 6.04 0.44 - 1.00 mg/dL   Calcium 9.0 8.9 -  10.3 mg/dL   GFR calc non Af Amer >60 >60 mL/min   GFR calc Af Amer >60 >60 mL/min   Anion gap 7 5 - 15  CBC  Result Value Ref Range   WBC 11.6 (H) 4.0 - 10.5 K/uL   RBC 4.43 3.87 - 5.11 MIL/uL   Hemoglobin 13.9 12.0 - 15.0 g/dL   HCT 16.1 09.6 - 04.5 %   MCV 92.8 78.0 - 100.0 fL   MCH 31.4 26.0 - 34.0 pg   MCHC 33.8 30.0 - 36.0 g/dL   RDW 40.9 81.1 - 91.4 %   Platelets 253 150 - 400 K/uL  D-dimer, quantitative  Result Value Ref Range   D-Dimer, Quant 0.35 0.00 - 0.50 ug/mL-FEU  Pregnancy, urine  Result Value Ref Range   Preg Test, Ur NEGATIVE NEGATIVE  Urinalysis, Routine w reflex microscopic  Result Value Ref Range   Color, Urine YELLOW YELLOW   APPearance CLEAR CLEAR   Specific Gravity, Urine 1.028 1.005 - 1.030   pH 5.0 5.0 - 8.0   Glucose, UA NEGATIVE NEGATIVE mg/dL   Hgb urine dipstick NEGATIVE NEGATIVE   Bilirubin Urine NEGATIVE NEGATIVE   Ketones, ur 5 (A) NEGATIVE mg/dL   Protein, ur NEGATIVE NEGATIVE mg/dL   Nitrite NEGATIVE NEGATIVE   Leukocytes, UA NEGATIVE NEGATIVE  I-stat troponin, ED  Result Value Ref Range   Troponin i, poc 0.00 0.00 - 0.08 ng/mL   Comment 3          I-stat troponin, ED  Result Value Ref Range   Troponin i, poc 0.00 0.00 -  0.08 ng/mL   Comment 3           Dg Chest 2 View Result Date: 04/24/2016 CLINICAL DATA:  Mid chest pain.  Bilateral leg numbness. EXAM: CHEST  2 VIEW COMPARISON:  08/03/2014 FINDINGS: The heart size and mediastinal contours are within normal limits. Both lungs are clear. The visualized skeletal structures are unremarkable. Postsurgical changes in the right upper quadrant likely from cholecystectomy noted. IMPRESSION: No active cardiopulmonary disease. Electronically Signed   By: Ted Mcalpine M.D.   On: 04/24/2016 21:45   Ct Head Wo Contrast Result Date: 04/24/2016 CLINICAL DATA:  Centralized chest pain radiating to the right arm starting around 1930 hours. Pain from waist down with tingling to bilateral extremities. EXAM: CT HEAD WITHOUT CONTRAST TECHNIQUE: Contiguous axial images were obtained from the base of the skull through the vertex without intravenous contrast. COMPARISON:  None. FINDINGS: BRAIN: The ventricles and sulci are normal. No intraparenchymal hemorrhage, mass effect nor midline shift. No acute large vascular territory infarcts. Grey-white matter distinction is maintained. The basal ganglia are unremarkable. No abnormal extra-axial fluid collections. Basal cisterns are patent. The brainstem and cerebellar hemispheres are without acute abnormalities. VASCULAR: Unremarkable. SKULL/SOFT TISSUES: No skull fracture. No significant soft tissue swelling. ORBITS/SINUSES: The included ocular globes and orbital contents are normal.The mastoid air cells are clear. The included paranasal sinuses demonstrate mild ethmoid sinus mucosal thickening partially visualized mucous retention cyst in the right maxillary sinus. The sphenoid sinus appears clear. The frontal sinus is developmentally under/not pneumatized. OTHER: None. IMPRESSION: No acute intracranial abnormality. Electronically Signed   By: Tollie Eth M.D.   On: 04/24/2016 22:47    2350:  Pt's mother is here now: states pt "has been  under a lot of stress," "working a lot" and "has been worried about her fibromyalgia acting up."  Doubt PE as cause for symptoms with normal d-dimer and low risk  Wells.  Doubt ACS as cause for symptoms with normal troponin x2 and unchanged EKG from previous. Pt's mother would like to take her home now. Tx symptomatically at this time. Dx and testing d/w pt and family.  Questions answered.  Verb understanding, agreeable to d/c home with outpt f/u.     Final Clinical Impressions(s) / ED Diagnoses   Final diagnoses:  None    New Prescriptions New Prescriptions   No medications on file      Samuel JesterKathleen Malcolm Quast, DO 04/28/16 2035

## 2016-04-24 NOTE — Discharge Instructions (Signed)
Take your usual prescriptions as previously directed.  Return to the hospital tomorrow as instructed to obtain an outpatient ultrasound of your leg.  This ultrasound will check to make sure you do not have a "blood clot" in the veins in your leg. You will receive the results of your testing shortly after having it completed.  If there is a blood clot in your leg, you will be re-directed to the Emergency Department for further evaluation.  If there is not a blood clot in your leg, your leg pain is likely due to muscle pain.  If that is the case, take over the counter tylenol and/or ibuprofen, as directed on packaging, as needed for discomfort.  Apply moist heat or ice, several times per day for 20 minutes each time.  Call your regular medical doctor tomorrow to schedule a follow up appointment this week.  Return to the Emergency Department immediately if worsening.

## 2016-04-25 NOTE — ED Notes (Signed)
Discharge instructions reviewed with patient, patient stated she did not want to have an ultrasound, despite the EDP speaking to her about its need. Offered to make an appointment, patient refused

## 2016-06-10 ENCOUNTER — Encounter: Payer: Self-pay | Admitting: *Deleted

## 2016-06-10 ENCOUNTER — Ambulatory Visit (INDEPENDENT_AMBULATORY_CARE_PROVIDER_SITE_OTHER): Payer: BLUE CROSS/BLUE SHIELD | Admitting: *Deleted

## 2016-06-10 DIAGNOSIS — Z3042 Encounter for surveillance of injectable contraceptive: Secondary | ICD-10-CM

## 2016-06-10 DIAGNOSIS — Z3202 Encounter for pregnancy test, result negative: Secondary | ICD-10-CM | POA: Diagnosis not present

## 2016-06-10 DIAGNOSIS — Z308 Encounter for other contraceptive management: Secondary | ICD-10-CM

## 2016-06-10 LAB — POCT URINE PREGNANCY: Preg Test, Ur: NEGATIVE

## 2016-06-10 MED ORDER — MEDROXYPROGESTERONE ACETATE 150 MG/ML IM SUSP
150.0000 mg | Freq: Once | INTRAMUSCULAR | Status: AC
  Administered 2016-06-10: 150 mg via INTRAMUSCULAR

## 2016-06-10 NOTE — Progress Notes (Signed)
Pt here for Depo. Pt tolerated shot well. Return in 12 weeks for next shot. JSY 

## 2016-09-02 ENCOUNTER — Other Ambulatory Visit: Payer: Self-pay | Admitting: Adult Health

## 2016-09-02 ENCOUNTER — Ambulatory Visit (INDEPENDENT_AMBULATORY_CARE_PROVIDER_SITE_OTHER): Payer: BLUE CROSS/BLUE SHIELD | Admitting: *Deleted

## 2016-09-02 DIAGNOSIS — Z3202 Encounter for pregnancy test, result negative: Secondary | ICD-10-CM

## 2016-09-02 DIAGNOSIS — Z3042 Encounter for surveillance of injectable contraceptive: Secondary | ICD-10-CM | POA: Diagnosis not present

## 2016-09-02 LAB — POCT URINE PREGNANCY: PREG TEST UR: NEGATIVE

## 2016-09-02 MED ORDER — MEDROXYPROGESTERONE ACETATE 150 MG/ML IM SUSP
150.0000 mg | Freq: Once | INTRAMUSCULAR | Status: AC
  Administered 2016-09-02: 150 mg via INTRAMUSCULAR

## 2016-09-02 NOTE — Progress Notes (Signed)
Pt here today for Depo Provera injection, pt reports no problems with Depo at this time, UPT resulted negative and injection given in Rt Deltoid.  Pt instructed to return in 12 weeks for next injection but will need OV with provider before any more refills will be given.  Pt verbalized understanding.

## 2016-09-02 NOTE — Telephone Encounter (Signed)
Pt was scheduled for Depo injection today, she called to refill her medication.  After reviewing her chart I don't see where she has been seen here since 07/31/2012.  Please advise.

## 2016-09-05 ENCOUNTER — Ambulatory Visit: Payer: BLUE CROSS/BLUE SHIELD | Admitting: Adult Health

## 2016-11-25 ENCOUNTER — Other Ambulatory Visit: Payer: BLUE CROSS/BLUE SHIELD | Admitting: Adult Health

## 2016-11-28 ENCOUNTER — Other Ambulatory Visit: Payer: BLUE CROSS/BLUE SHIELD | Admitting: Adult Health

## 2016-12-01 ENCOUNTER — Ambulatory Visit: Payer: BLUE CROSS/BLUE SHIELD

## 2016-12-01 ENCOUNTER — Ambulatory Visit (INDEPENDENT_AMBULATORY_CARE_PROVIDER_SITE_OTHER): Payer: BLUE CROSS/BLUE SHIELD | Admitting: Adult Health

## 2016-12-01 ENCOUNTER — Other Ambulatory Visit (HOSPITAL_COMMUNITY)
Admission: RE | Admit: 2016-12-01 | Discharge: 2016-12-01 | Disposition: A | Discharge status: Home / Self Care | Payer: BLUE CROSS/BLUE SHIELD | Source: Ambulatory Visit | Attending: Adult Health | Admitting: Adult Health

## 2016-12-01 ENCOUNTER — Encounter: Payer: Self-pay | Admitting: Adult Health

## 2016-12-01 VITALS — BP 120/70 | HR 76 | Ht 61.0 in | Wt 197.4 lb

## 2016-12-01 DIAGNOSIS — R87810 Cervical high risk human papillomavirus (HPV) DNA test positive: Secondary | ICD-10-CM | POA: Diagnosis not present

## 2016-12-01 DIAGNOSIS — Z01419 Encounter for gynecological examination (general) (routine) without abnormal findings: Secondary | ICD-10-CM | POA: Diagnosis not present

## 2016-12-01 DIAGNOSIS — Z3042 Encounter for surveillance of injectable contraceptive: Secondary | ICD-10-CM

## 2016-12-01 DIAGNOSIS — Z01411 Encounter for gynecological examination (general) (routine) with abnormal findings: Secondary | ICD-10-CM | POA: Diagnosis not present

## 2016-12-01 MED ORDER — MEDROXYPROGESTERONE ACETATE 150 MG/ML IM SUSP: INTRAMUSCULAR | 4 refills | Status: DC

## 2016-12-01 MED ORDER — MEDROXYPROGESTERONE ACETATE 150 MG/ML IM SUSP
150.0000 mg | Freq: Once | INTRAMUSCULAR | Status: AC
  Administered 2016-12-01: 150 mg via INTRAMUSCULAR

## 2016-12-01 NOTE — Addendum Note (Signed)
Addended by: Federico FlakeNES, PEGGY A on: 12/01/2016 09:53 AM   Modules accepted: Kipp BroodSmartSet

## 2016-12-01 NOTE — Addendum Note (Signed)
Addended by: Federico FlakeNES, PEGGY A on: 12/01/2016 10:26 AM   Modules accepted: Orders

## 2016-12-01 NOTE — Addendum Note (Signed)
Addended by: Federico FlakeNES, Gilliam Hawkes A on: 12/01/2016 10:18 AM   Modules accepted: Orders

## 2016-12-01 NOTE — Progress Notes (Signed)
Patient ID: Jennifer Gonzales, female   DOB: [birth year], 36 y.o.   MRN: [id number] History of Present Illness: Jennifer Gonzales is a 36 year old white female, single , G2P2 in for a well woman gyn exam and pap, and depo is due. No PCP,sees NP at work, works at [employer].   Current Medications, Allergies, Past Medical History, Past Surgical History, Family History and Social History were reviewed in [facility] electronic medical record.     Review of Systems:  Patient denies any headaches, hearing loss, fatigue, blurred vision, shortness of breath, chest pain, abdominal pain, problems with bowel movements, urination, or intercourse(not currently active, just broke up with partner of 3 years). 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    Physical Exam:BP 120/70 (BP Location: Left Arm, Patient Position: Sitting, Cuff Size: Small)   Pulse 76   Ht 5\' 1"  (1.549 m)   Wt 197 lb 6.4 oz (89.5 kg)   BMI 37.30 kg/m UPT negative. General:  Well developed, well nourished, no acute distress Skin:  Warm and dry Neck:  Midline trachea, normal thyroid, good ROM, no lymphadenopathy Lungs; Clear to auscultation bilaterally Breast:  No dominant palpable mass, retraction, or nipple discharge Cardiovascular: Regular rate and rhythm Abdomen:  Soft, non tender, no hepatosplenomegaly Pelvic:  External genitalia is normal in appearance, no lesions.  The vagina is normal in appearance. Urethra has no lesions or masses. The cervix is bulbous.Pap with HPV and GC/CHL performed.   Uterus is felt to be normal size, shape, and contour.  No adnexal masses or tenderness noted.Bladder is non tender, no masses felt. Extremities/musculoskeletal:  No swelling or varicosities noted, no clubbing or cyanosis Psych:  No mood changes, alert and cooperative,seems happy PHQ 2 score 0.  Impression: 1. Encounter for routine gynecological examination with Papanicolaou smear of cervix   2.  Encounter for surveillance of injectable contraceptive       Plan: Labs at work Physical in 1 year, pap in 3 if normal Meds ordered this encounter  Medications  . medroxyPROGESTERone (DEPO-PROVERA) 150 MG/ML injection    Sig: use as directed at physician's office every 3 months    Dispense:  1 mL    Refill:  4    Order Specific Question:   Supervising Provider    Answer:   Lazaro ArmsEURE, LUTHER H [[id number]]   To get depo today

## 2016-12-02 LAB — CYTOLOGY - PAP
CHLAMYDIA, DNA PROBE: NEGATIVE
Diagnosis: NEGATIVE
HPV: DETECTED — AB
NEISSERIA GONORRHEA: NEGATIVE

## 2016-12-05 ENCOUNTER — Encounter: Payer: Self-pay | Admitting: Adult Health

## 2016-12-05 DIAGNOSIS — R87810 Cervical high risk human papillomavirus (HPV) DNA test positive: Secondary | ICD-10-CM

## 2016-12-05 HISTORY — DX: Cervical high risk human papillomavirus (HPV) DNA test positive: R87.810

## 2017-02-08 DIAGNOSIS — M179 Osteoarthritis of knee, unspecified: Secondary | ICD-10-CM | POA: Insufficient documentation

## 2017-02-08 DIAGNOSIS — M25562 Pain in left knee: Secondary | ICD-10-CM | POA: Insufficient documentation

## 2017-02-23 ENCOUNTER — Ambulatory Visit (INDEPENDENT_AMBULATORY_CARE_PROVIDER_SITE_OTHER): Payer: BLUE CROSS/BLUE SHIELD | Admitting: *Deleted

## 2017-02-23 ENCOUNTER — Encounter: Payer: Self-pay | Admitting: *Deleted

## 2017-02-23 DIAGNOSIS — Z3202 Encounter for pregnancy test, result negative: Secondary | ICD-10-CM

## 2017-02-23 DIAGNOSIS — Z3042 Encounter for surveillance of injectable contraceptive: Secondary | ICD-10-CM | POA: Diagnosis not present

## 2017-02-23 DIAGNOSIS — Z308 Encounter for other contraceptive management: Secondary | ICD-10-CM

## 2017-02-23 LAB — POCT URINE PREGNANCY: Preg Test, Ur: NEGATIVE

## 2017-02-23 MED ORDER — MEDROXYPROGESTERONE ACETATE 150 MG/ML IM SUSP
150.0000 mg | Freq: Once | INTRAMUSCULAR | Status: AC
  Administered 2017-02-23: 150 mg via INTRAMUSCULAR

## 2017-02-23 NOTE — Progress Notes (Signed)
Pt here for Depo. Pt tolerated shot well. Return in 12 weeks for next shot. JSY 

## 2017-05-18 ENCOUNTER — Ambulatory Visit (INDEPENDENT_AMBULATORY_CARE_PROVIDER_SITE_OTHER): Payer: BLUE CROSS/BLUE SHIELD

## 2017-05-18 ENCOUNTER — Ambulatory Visit: Payer: BLUE CROSS/BLUE SHIELD

## 2017-05-18 VITALS — Wt 199.6 lb

## 2017-05-18 DIAGNOSIS — Z3042 Encounter for surveillance of injectable contraceptive: Secondary | ICD-10-CM

## 2017-05-18 DIAGNOSIS — Z3049 Encounter for surveillance of other contraceptives: Secondary | ICD-10-CM

## 2017-05-18 DIAGNOSIS — Z3202 Encounter for pregnancy test, result negative: Secondary | ICD-10-CM

## 2017-05-18 LAB — POCT URINE PREGNANCY: PREG TEST UR: NEGATIVE

## 2017-05-18 MED ORDER — MEDROXYPROGESTERONE ACETATE 150 MG/ML IM SUSP
150.0000 mg | Freq: Once | INTRAMUSCULAR | Status: AC
  Administered 2017-05-18: 150 mg via INTRAMUSCULAR

## 2017-05-18 NOTE — Addendum Note (Signed)
Addended by: Federico FlakeNES, Jorey Dollard A on: 05/18/2017 02:32 PM   Modules accepted: Level of Service

## 2017-05-18 NOTE — Progress Notes (Signed)
Pt here for depo shot 150 mg IM given rt deltoid. Tolerated well. Return 12 weeks for next shot.pad CMA 

## 2017-07-13 IMAGING — DX DG CHEST 2V
2 series · 2 of 2 positions shown · non-contrast
Comparison: 08/03/2014

CLINICAL DATA: Mid chest pain.  Bilateral leg numbness.

EXAM:
CHEST  2 VIEW

[chest pa]
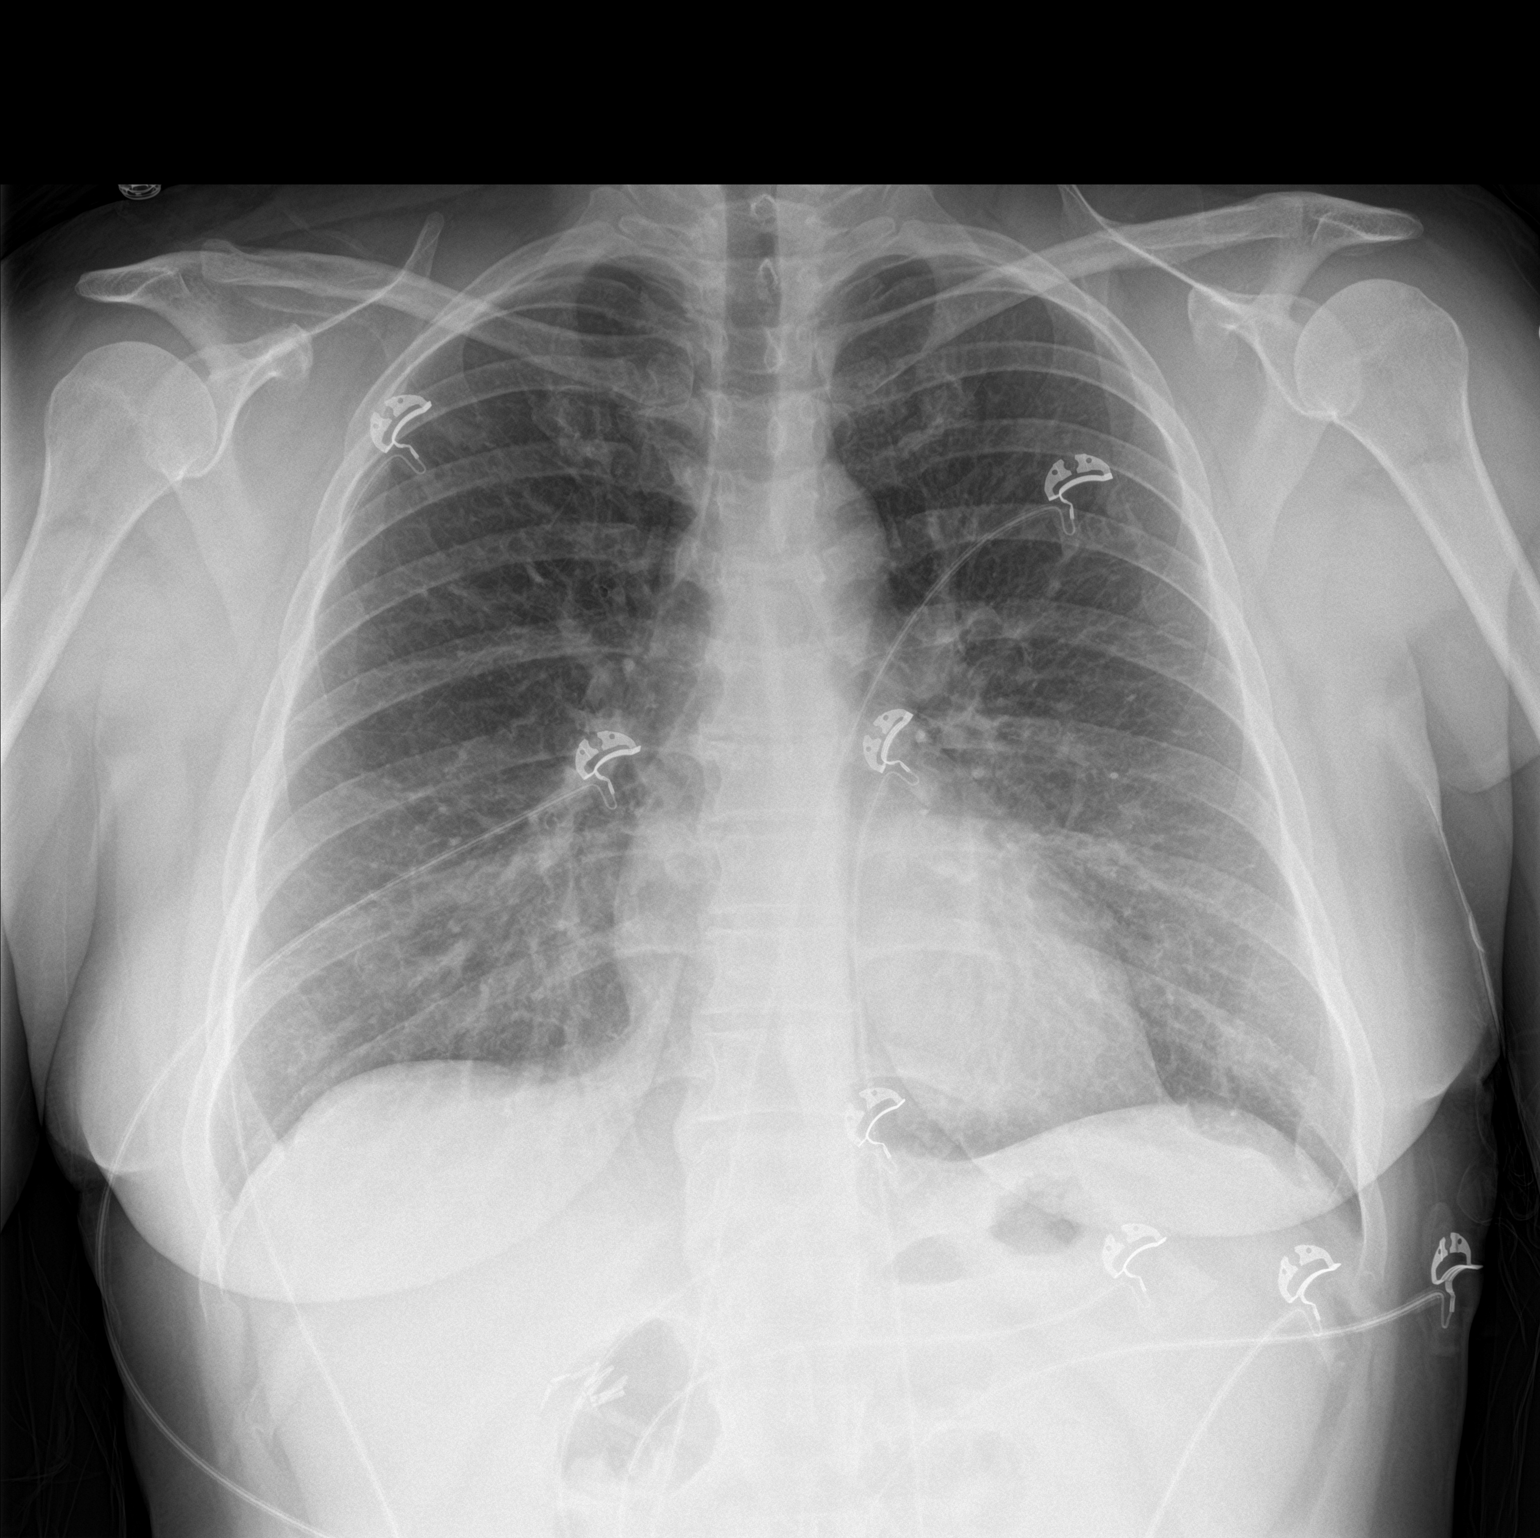

[chest lat]
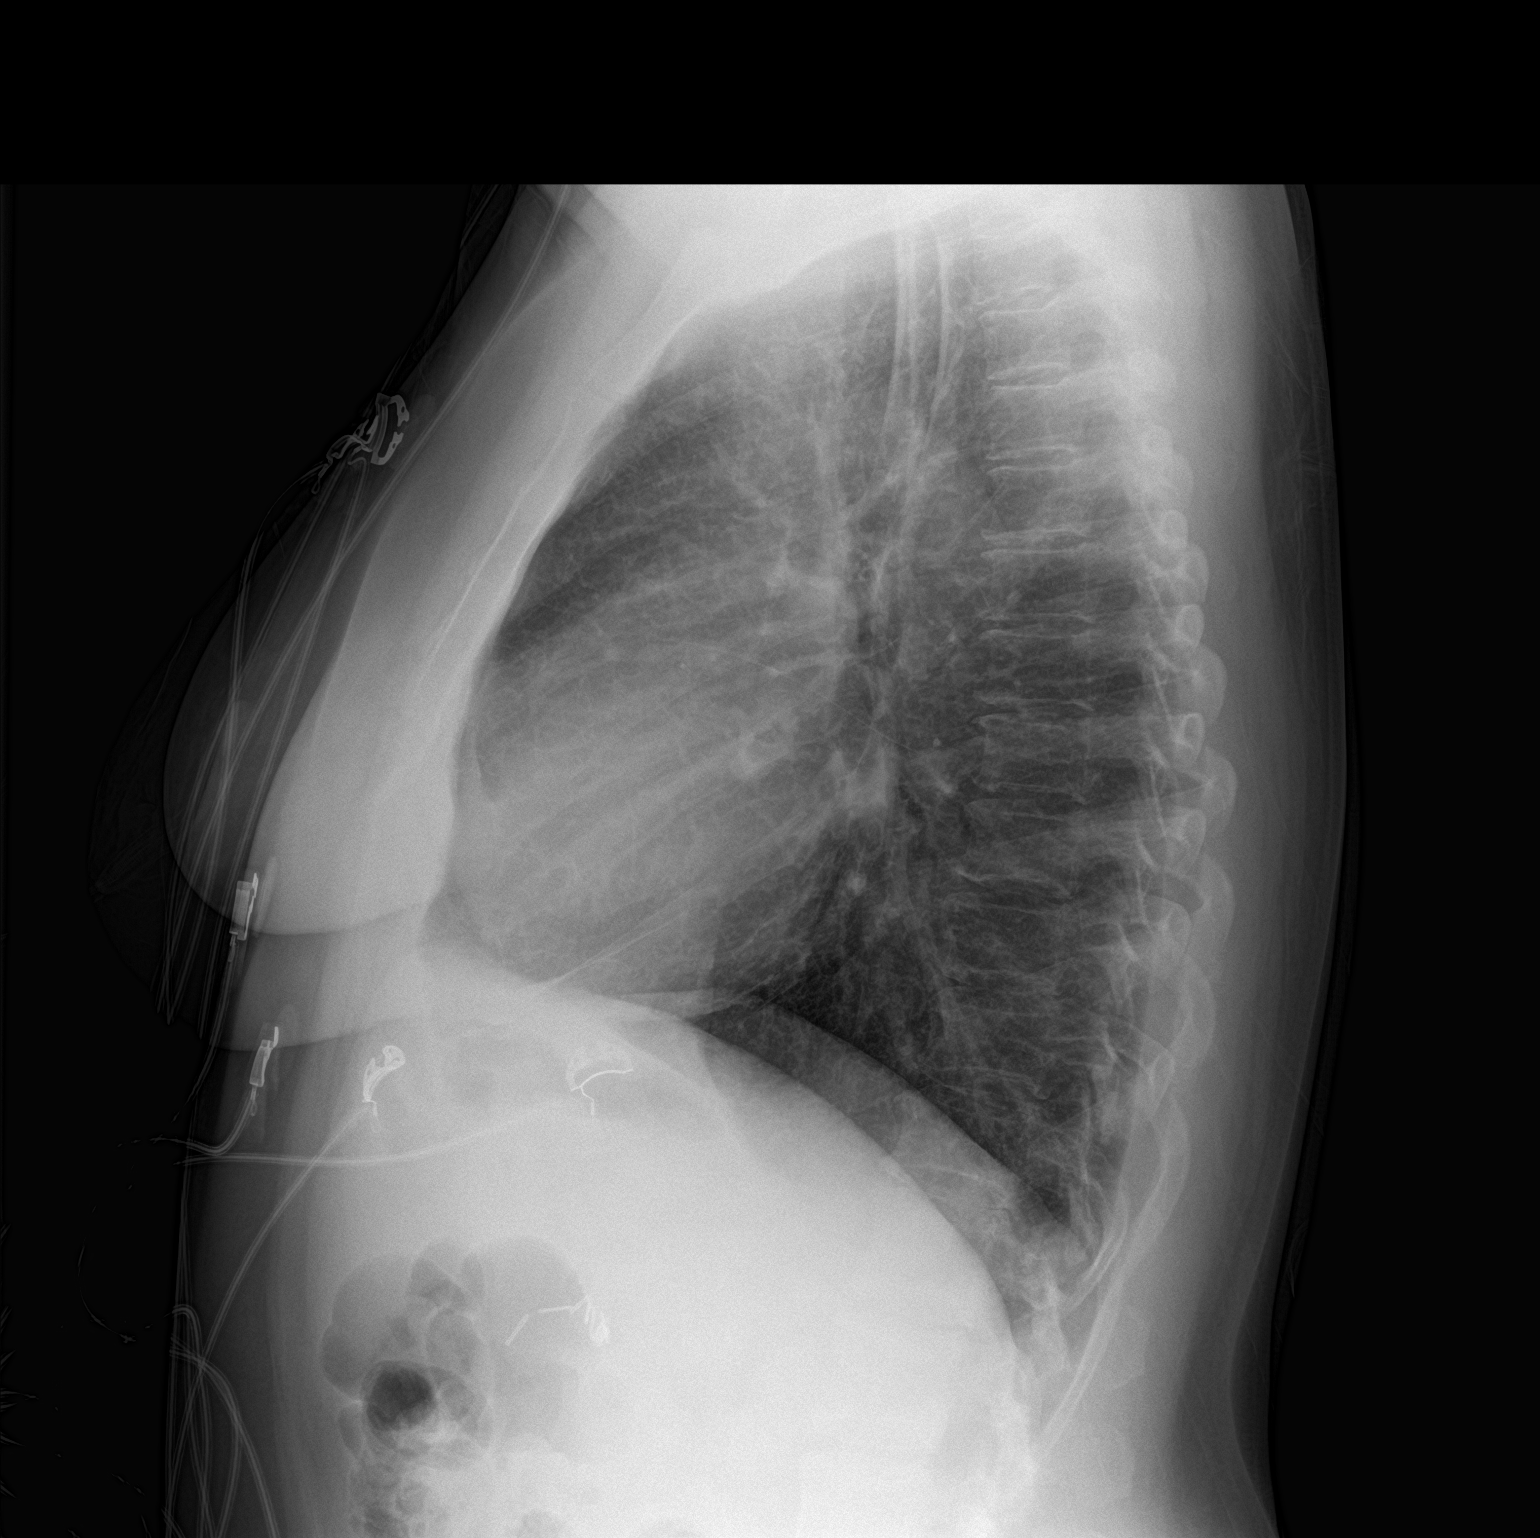

[2 of 2 positions shown; findings below may reference images not displayed]

FINDINGS: The heart size and mediastinal contours are within normal limits.
Both lungs are clear. The visualized skeletal structures are
unremarkable.

Postsurgical changes in the right upper quadrant likely from
cholecystectomy noted.
IMPRESSION: No active cardiopulmonary disease.

## 2017-08-10 ENCOUNTER — Ambulatory Visit (INDEPENDENT_AMBULATORY_CARE_PROVIDER_SITE_OTHER): Payer: BLUE CROSS/BLUE SHIELD | Admitting: *Deleted

## 2017-08-10 VITALS — Wt 209.0 lb

## 2017-08-10 DIAGNOSIS — Z3042 Encounter for surveillance of injectable contraceptive: Secondary | ICD-10-CM

## 2017-08-10 DIAGNOSIS — Z3202 Encounter for pregnancy test, result negative: Secondary | ICD-10-CM | POA: Diagnosis not present

## 2017-08-10 LAB — POCT URINE PREGNANCY: Preg Test, Ur: NEGATIVE

## 2017-08-10 MED ORDER — MEDROXYPROGESTERONE ACETATE 150 MG/ML IM SUSP
150.0000 mg | Freq: Once | INTRAMUSCULAR | Status: AC
  Administered 2017-08-10: 150 mg via INTRAMUSCULAR

## 2017-08-10 NOTE — Progress Notes (Signed)
Pt in for depo provera injection. Given in left arm. Patient tolerated well. Next dose in 12 weeks.

## 2017-11-02 ENCOUNTER — Ambulatory Visit (INDEPENDENT_AMBULATORY_CARE_PROVIDER_SITE_OTHER): Payer: BLUE CROSS/BLUE SHIELD | Admitting: *Deleted

## 2017-11-02 ENCOUNTER — Encounter: Payer: Self-pay | Admitting: *Deleted

## 2017-11-02 VITALS — Wt 214.0 lb

## 2017-11-02 DIAGNOSIS — Z3202 Encounter for pregnancy test, result negative: Secondary | ICD-10-CM

## 2017-11-02 DIAGNOSIS — Z3042 Encounter for surveillance of injectable contraceptive: Secondary | ICD-10-CM

## 2017-11-02 LAB — POCT URINE PREGNANCY: Preg Test, Ur: NEGATIVE

## 2017-11-02 MED ORDER — MEDROXYPROGESTERONE ACETATE 150 MG/ML IM SUSP
150.0000 mg | Freq: Once | INTRAMUSCULAR | Status: AC
  Administered 2017-11-02: 150 mg via INTRAMUSCULAR

## 2017-11-02 NOTE — Progress Notes (Signed)
Depo Provera 150mg given in right deltoid with no complications.Pt to return in 12 weeks for next injection.  

## 2017-12-08 DIAGNOSIS — F17210 Nicotine dependence, cigarettes, uncomplicated: Secondary | ICD-10-CM | POA: Insufficient documentation

## 2017-12-10 ENCOUNTER — Emergency Department (HOSPITAL_COMMUNITY)
Admission: EM | Admit: 2017-12-10 | Discharge: 2017-12-10 | Disposition: A | Discharge status: Home / Self Care | Payer: BLUE CROSS/BLUE SHIELD | Attending: Emergency Medicine | Admitting: Emergency Medicine

## 2017-12-10 ENCOUNTER — Encounter (HOSPITAL_COMMUNITY): Payer: Self-pay | Admitting: Emergency Medicine

## 2017-12-10 ENCOUNTER — Emergency Department (HOSPITAL_COMMUNITY): Payer: BLUE CROSS/BLUE SHIELD

## 2017-12-10 ENCOUNTER — Other Ambulatory Visit: Payer: Self-pay

## 2017-12-10 DIAGNOSIS — J40 Bronchitis, not specified as acute or chronic: Secondary | ICD-10-CM | POA: Insufficient documentation

## 2017-12-10 DIAGNOSIS — Z79899 Other long term (current) drug therapy: Secondary | ICD-10-CM | POA: Insufficient documentation

## 2017-12-10 DIAGNOSIS — E785 Hyperlipidemia, unspecified: Secondary | ICD-10-CM | POA: Insufficient documentation

## 2017-12-10 DIAGNOSIS — F17210 Nicotine dependence, cigarettes, uncomplicated: Secondary | ICD-10-CM | POA: Diagnosis not present

## 2017-12-10 DIAGNOSIS — R05 Cough: Secondary | ICD-10-CM | POA: Diagnosis present

## 2017-12-10 MED ORDER — PREDNISONE 50 MG PO TABS
60.0000 mg | ORAL_TABLET | Freq: Once | ORAL | Status: AC
  Administered 2017-12-10: 60 mg via ORAL
  Filled 2017-12-10: qty 1

## 2017-12-10 MED ORDER — PREDNISONE 10 MG PO TABS: 20.0000 mg | ORAL_TABLET | Freq: Every day | ORAL | 0 refills | Status: DC

## 2017-12-10 MED ORDER — AEROCHAMBER PLUS FLO-VU MEDIUM MISC
1.0000 | Freq: Once | Status: AC
  Administered 2017-12-10: 1
  Filled 2017-12-10: qty 1

## 2017-12-10 MED ORDER — ALBUTEROL SULFATE (2.5 MG/3ML) 0.083% IN NEBU
5.0000 mg | INHALATION_SOLUTION | Freq: Once | RESPIRATORY_TRACT | Status: AC
  Administered 2017-12-10: 5 mg via RESPIRATORY_TRACT
  Filled 2017-12-10: qty 6

## 2017-12-10 MED ORDER — ALBUTEROL SULFATE HFA 108 (90 BASE) MCG/ACT IN AERS
2.0000 | INHALATION_SPRAY | RESPIRATORY_TRACT | Status: DC | PRN
  Administered 2017-12-10: 2 via RESPIRATORY_TRACT
  Filled 2017-12-10: qty 6.7

## 2017-12-10 NOTE — ED Triage Notes (Signed)
Pt states she has been coughing for a week and was seen at [facility] and given a "Z Pack". Pt states she is unable to cough up anything and that her ribs are hurting from all the coughing.

## 2017-12-10 NOTE — ED Provider Notes (Signed)
[facility] EMERGENCY DEPARTMENT Provider Note   CSN: [id number] Arrival date & time: 12/10/17  2203     History   Chief Complaint Chief Complaint  Patient presents with  . Coughing    HPI Jennifer Gonzales is a 37 y.o. female.  HPI 37 year old female history of smoking presents today complaining of cough for 1 week.  She states that she was seen by her doctor at no violent.  She was given a Z-Pak.  She did not have any testing done.  She is continued to cough.  She normally smokes 10 cigarettes a day but has been down to 5 cigarettes a day over the past week.  She denies any history of asthma or COPD.  Cough has been nonproductive.  She has been subjectively hot and cold but has not had a definitive fever.  She denies dyspnea but does complain of chest pain with coughing. Past Medical History:  Diagnosis Date  . Back pain   . BV (bacterial vaginosis)   . 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 Fibromyalgia   . Hx of migraines   . Mental disorder     Patient Active Problem List   Diagnosis Date Noted  . 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 Hyperlipemia 04/05/2015  . Esophageal reflux 04/05/2015  . Obesity, unspecified 04/05/2015  . Hx of migraines 07/28/2012  . Depression 07/28/2012  . Back pain 07/28/2012  . Fibromyalgia 07/28/2012  . BV (bacterial vaginosis) 07/28/2012    Past Surgical History:  Procedure Laterality Date  . BACK SURGERY    . CESAREAN SECTION    . CHOLECYSTECTOMY    . TONSILLECTOMY       OB History    Gravida  2   Para  2   Term  2   Preterm      AB      Living  2     SAB      TAB      Ectopic      Multiple      Live Births  2            Home Medications    Prior to Admission medications   Medication Sig Start Date End Date Taking? Authorizing Provider  cetirizine (ZYRTEC) 10 MG tablet Take 10 mg by mouth daily as needed for allergies.     [provider]  ibuprofen (ADVIL,MOTRIN) 200 MG tablet Take 800 mg by mouth daily as needed for mild pain or moderate pain.     [provider]  medroxyPROGESTERone (DEPO-PROVERA) 150 MG/ML injection use as directed at physician's office every 3 months 12/01/16   Cyril Mourning A, NP  omeprazole (PRILOSEC) 40 MG capsule Take 40 mg by mouth as needed.  11/27/15   [provider]  topiramate (TOPAMAX) 25 MG tablet Take by mouth. 07/17/17   [provider]  pantoprazole (PROTONIX) 40 MG tablet Take 1 tablet (40 mg total) by mouth daily. Patient not taking: Reported on 04/08/2015 04/02/15 04/08/15  Jacquelin Hawking, PA-C    Family History Family History  Problem Relation Age of Onset  . Hypertension Other   . Diabetes Other   . Stroke Other   . Heart disease Other   . Cancer Paternal Grandmother        breast  . Diabetes Father   . Muscular dystrophy Mother   . Muscular dystrophy Brother   . Muscular dystrophy  Sister   . Muscular dystrophy Sister   . Asthma Son   . Other Son        allergies    Social History Social History   Tobacco Use  . Smoking status: Current Some Day Smoker    Packs/day: 0.00    Years: 23.00    Pack years: 0.00    Types: Cigarettes  . Smokeless tobacco: Never Used  . Tobacco comment: smokes 2-4 cig daily  Substance Use Topics  . Alcohol use: No  . Drug use: No     Allergies   Ketorolac tromethamine and Sulfa antibiotics   Review of Systems Review of Systems  All other systems reviewed and are negative.    Physical Exam Updated Vital Signs BP (!) 133/94 (BP Location: Right Arm)   Pulse 94   Temp 98.8 F (37.1 C) (Oral)   Resp 17   Ht 1.524 m (5')   Wt 97.1 kg (214 lb)   SpO2 97%   BMI 41.79 kg/m   Physical Exam  Constitutional: She is oriented to person, place, and time. She appears well-developed and well-nourished.  HENT:  Head: Normocephalic and atraumatic.  Right Ear: External ear normal.   Left Ear: External ear normal.  Nose: Nose normal.  Mouth/Throat: Oropharynx is clear and moist.  Eyes: Pupils are equal, round, and reactive to light. EOM are normal.  Neck: Normal range of motion. Neck supple.  Cardiovascular: Normal rate, regular rhythm, normal heart sounds and intact distal pulses.  Pulmonary/Chest: Effort normal and breath sounds normal.  Abdominal: Soft. Bowel sounds are normal.  Musculoskeletal: Normal range of motion.  Neurological: She is alert and oriented to person, place, and time.  Skin: Skin is warm and dry. Capillary refill takes less than 2 seconds.  Psychiatric: She has a normal mood and affect.  Nursing note and vitals reviewed.    ED Treatments / Results  Labs (all labs ordered are listed, but only abnormal results are displayed) Labs Reviewed - No data to display  EKG None  Radiology Dg Chest 2 View  Result Date: 12/10/2017 CLINICAL DATA:  Cough for 1 week, given antibiotics. EXAM: CHEST - 2 VIEW COMPARISON:  Chest radiograph April 24, 2016. FINDINGS: Cardiac silhouette is mildly enlarged, increased from prior examination. Diffuse interstitial prominence without pleural effusion or focal consolidation. No pneumothorax. Surgical clips in the included right abdomen compatible with cholecystectomy. Osseous structures are normal. IMPRESSION: 1. New cardiomegaly. 2. Interstitial prominence may be infectious or, potentially edema. Electronically Signed   By: Awilda Metro M.D.   On: 12/10/2017 22:30    Procedures Procedures (including critical care time)  Medications Ordered in ED Medications  albuterol (PROVENTIL) (2.5 MG/3ML) 0.083% nebulizer solution 5 mg (has no administration in time range)     Initial Impression / Assessment and Plan / ED Course  I have reviewed the triage vital signs and the nursing notes.  Pertinent labs & imaging results that were available during my care of the patient were reviewed by me and considered in my  medical decision making (see chart for details).     Reviewed x-Maurina Fawaz results with patient.  This may be infectious, most likely bronchitis, patient is to continue her Zithromax.  She is treated here with prednisone and albuterol.  Patient advised regarding need for close follow-up and return precautions. Final Clinical Impressions(s) / ED Diagnoses   Final diagnoses:  Bronchitis    ED Discharge Orders    None  Margarita Grizzleay, Earnestene Angello, MD 12/10/17 [phone]

## 2017-12-10 NOTE — Discharge Instructions (Signed)
Take all medications as prescribed Use inhaler every four hours Stop smoking Recheck with your doctor this week

## 2017-12-28 ENCOUNTER — Encounter: Payer: Self-pay | Admitting: Adult Health

## 2018-01-24 ENCOUNTER — Encounter: Payer: Self-pay | Admitting: Adult Health

## 2018-01-24 ENCOUNTER — Ambulatory Visit (INDEPENDENT_AMBULATORY_CARE_PROVIDER_SITE_OTHER): Payer: Commercial Managed Care - PPO

## 2018-01-24 ENCOUNTER — Ambulatory Visit (INDEPENDENT_AMBULATORY_CARE_PROVIDER_SITE_OTHER): Payer: Commercial Managed Care - PPO | Admitting: Adult Health

## 2018-01-24 ENCOUNTER — Other Ambulatory Visit (HOSPITAL_COMMUNITY)
Admission: RE | Admit: 2018-01-24 | Discharge: 2018-01-24 | Disposition: A | Discharge status: Home / Self Care | Payer: BLUE CROSS/BLUE SHIELD | Source: Ambulatory Visit | Attending: Adult Health | Admitting: Adult Health

## 2018-01-24 VITALS — Ht 61.0 in | Wt 209.0 lb

## 2018-01-24 VITALS — BP 127/88 | HR 88 | Ht 61.0 in | Wt 209.0 lb

## 2018-01-24 DIAGNOSIS — Z1151 Encounter for screening for human papillomavirus (HPV): Secondary | ICD-10-CM | POA: Insufficient documentation

## 2018-01-24 DIAGNOSIS — Z3202 Encounter for pregnancy test, result negative: Secondary | ICD-10-CM

## 2018-01-24 DIAGNOSIS — Z3042 Encounter for surveillance of injectable contraceptive: Secondary | ICD-10-CM

## 2018-01-24 DIAGNOSIS — Z01419 Encounter for gynecological examination (general) (routine) without abnormal findings: Secondary | ICD-10-CM | POA: Insufficient documentation

## 2018-01-24 LAB — POCT URINE PREGNANCY: Preg Test, Ur: NEGATIVE

## 2018-01-24 MED ORDER — MEDROXYPROGESTERONE ACETATE 150 MG/ML IM SUSP
150.0000 mg | Freq: Once | INTRAMUSCULAR | Status: AC
  Administered 2018-01-24: 150 mg via INTRAMUSCULAR

## 2018-01-24 MED ORDER — MEDROXYPROGESTERONE ACETATE 150 MG/ML IM SUSP: INTRAMUSCULAR | 4 refills | Status: DC

## 2018-01-24 NOTE — Progress Notes (Signed)
Pt here for depo injection 150 mg IM given lt deltoid. Tolerated well.Return 12 weeks for next injection. Pad CMA 

## 2018-01-24 NOTE — Progress Notes (Signed)
Patient ID: [name], female   DOB: 08-23-1980, 37 y.o.   MRN: [id number] History of Present Illness: Jennifer Gonzales is a 37 year old white female, single, G2P2, in for well woman gyn exam and pap. Working as Glass blower/designer. Pap 12/01/16 +HPV. PCP is Jennifer Glatter, PA., [facility] in [place].   Current Medications, Allergies, Past Medical History, Past Surgical History, Family History and Social History were reviewed in [employer] record.     Review of Systems:  Patient denies any headaches, hearing loss, fatigue, blurred vision, shortness of breath, chest pain, abdominal pain, problems with bowel movements, urination, or intercourse. No joint pain or Gonzales swings. Happy with depo.  Physical Exam:BP 127/88 (BP Location: Left Arm, Patient Position: Sitting, Cuff Size: Large)   Pulse 88   Ht 5\' 1"  (1.549 m)   Wt 209 lb (94.8 kg)   BMI 39.49 kg/m  General:  Well developed, well nourished, no acute distress Skin:  Warm and dry Neck:  Midline trachea, normal thyroid, good ROM, no lymphadenopathy Lungs; Clear to auscultation bilaterally Breast:  No dominant palpable mass, retraction, or nipple discharge Cardiovascular: Regular rate and rhythm Abdomen:  Soft, non tender, no hepatosplenomegaly Pelvic:  External genitalia is normal in appearance, no lesions.  The vagina is normal in appearance. Urethra has no lesions or masses. The cervix is bulbous. Pap with HPV performed. Uterus is felt to be normal size, shape, and contour.  No adnexal masses or tenderness noted.Bladder is non tender, no masses felt. Extremities/musculoskeletal:  No swelling or varicosities noted, no clubbing or cyanosis Psych:  No Gonzales changes, alert and cooperative,seems happy PHQ 2 score 0. Examination chaperoned by Jennifer Mood LPN.  Impression: 1. Encounter for gynecological examination with Papanicolaou smear of cervix   2. Encounter for surveillance of injectable contraceptive       Plan: Return today  for depo Meds ordered this encounter  Medications  . medroxyPROGESTERone (DEPO-PROVERA) 150 MG/ML injection    Sig: use as directed at physician's office every 3 months    Dispense:  1 mL    Refill:  4    Order Specific Question:   Supervising Provider    Answer:   Jennifer Gonzales [[id number]]  Physical in 1 year Pap in 3 years if normal  Mammogram at 40  Labs with PCP

## 2018-01-25 ENCOUNTER — Other Ambulatory Visit: Payer: BLUE CROSS/BLUE SHIELD | Admitting: Adult Health

## 2018-01-25 LAB — CYTOLOGY - PAP
Diagnosis: NEGATIVE
HPV: DETECTED — AB

## 2018-01-30 ENCOUNTER — Telehealth: Payer: Self-pay | Admitting: Adult Health

## 2018-01-30 NOTE — Telephone Encounter (Signed)
Left message to call me about her pap, (if she calls pap is negative for malignancy but +HPV and this is second time in a row, so needs colpo and had yeast, if itching use monistat, if not, do not have to treat)

## 2018-01-30 NOTE — Telephone Encounter (Signed)
Pt aware that pap +HPV again, will get colpo with Dr Despina HiddenEure set up

## 2018-02-16 ENCOUNTER — Ambulatory Visit (INDEPENDENT_AMBULATORY_CARE_PROVIDER_SITE_OTHER): Payer: Commercial Managed Care - PPO | Admitting: Obstetrics & Gynecology

## 2018-02-16 ENCOUNTER — Encounter: Payer: Self-pay | Admitting: Obstetrics & Gynecology

## 2018-02-16 ENCOUNTER — Other Ambulatory Visit: Payer: Self-pay

## 2018-02-16 VITALS — BP 124/86 | HR 83 | Ht 60.0 in | Wt 211.0 lb

## 2018-02-16 DIAGNOSIS — Z3202 Encounter for pregnancy test, result negative: Secondary | ICD-10-CM | POA: Diagnosis not present

## 2018-02-16 DIAGNOSIS — R87810 Cervical high risk human papillomavirus (HPV) DNA test positive: Secondary | ICD-10-CM

## 2018-02-16 LAB — POCT URINE PREGNANCY: PREG TEST UR: NEGATIVE

## 2018-02-16 NOTE — Progress Notes (Signed)
Colposcopy Procedure Note:  Colposcopy Procedure Note  Indications: Pap smear 1 months ago showed: no cellular changes with +HPV for 2nd year in a row. The prior pap showed +HPV.  Prior cervical/vaginal disease: . Prior cervical treatment: .  Smoker:  Yes.   New sexual partner:  Yes.    : time frame:  2 years  History of abnormal Pap: yes  Procedure Details  The risks and benefits of the procedure and Written informed consent obtained.  Speculum placed in vagina and excellent visualization of cervix achieved, cervix swabbed x 3 with acetic acid solution.  Findings: Cervix: no visible lesions, no mosaicism, no punctation and no abnormal vasculature; SCJ visualized 360 degrees without lesions and no biopsies taken. Vaginal inspection: normal without visible lesions. Vulvar colposcopy: vulvar colposcopy not performed.  Specimens: none  Complications: none.  Plan: Repeat Pap smear in 1 year Smoking cessation, HPV is discussed fully

## 2018-02-22 ENCOUNTER — Emergency Department (HOSPITAL_COMMUNITY): Payer: Commercial Managed Care - PPO

## 2018-02-22 ENCOUNTER — Other Ambulatory Visit: Payer: Self-pay

## 2018-02-22 ENCOUNTER — Emergency Department (HOSPITAL_COMMUNITY)
Admission: EM | Admit: 2018-02-22 | Discharge: 2018-02-23 | Disposition: A | Discharge status: Home / Self Care | Payer: Commercial Managed Care - PPO | Attending: Emergency Medicine | Admitting: Emergency Medicine

## 2018-02-22 ENCOUNTER — Encounter (HOSPITAL_COMMUNITY): Payer: Self-pay | Admitting: Emergency Medicine

## 2018-02-22 DIAGNOSIS — F329 Major depressive disorder, single episode, unspecified: Secondary | ICD-10-CM | POA: Insufficient documentation

## 2018-02-22 DIAGNOSIS — F17210 Nicotine dependence, cigarettes, uncomplicated: Secondary | ICD-10-CM | POA: Diagnosis not present

## 2018-02-22 DIAGNOSIS — Z9049 Acquired absence of other specified parts of digestive tract: Secondary | ICD-10-CM | POA: Insufficient documentation

## 2018-02-22 DIAGNOSIS — R0789 Other chest pain: Secondary | ICD-10-CM

## 2018-02-22 DIAGNOSIS — R079 Chest pain, unspecified: Secondary | ICD-10-CM | POA: Insufficient documentation

## 2018-02-22 DIAGNOSIS — Z79899 Other long term (current) drug therapy: Secondary | ICD-10-CM | POA: Diagnosis not present

## 2018-02-22 LAB — CBC WITH DIFFERENTIAL/PLATELET
ABS IMMATURE GRANULOCYTES: 0.08 10*3/uL — AB (ref 0.00–0.07)
BASOS PCT: 1 %
Basophils Absolute: 0.1 10*3/uL (ref 0.0–0.1)
EOS ABS: 0.4 10*3/uL (ref 0.0–0.5)
EOS PCT: 4 %
HCT: 43.7 % (ref 36.0–46.0)
Hemoglobin: 14.4 g/dL (ref 12.0–15.0)
Immature Granulocytes: 1 %
Lymphocytes Relative: 32 %
Lymphs Abs: 3.5 10*3/uL (ref 0.7–4.0)
MCH: 30.4 pg (ref 26.0–34.0)
MCHC: 33 g/dL (ref 30.0–36.0)
MCV: 92.4 fL (ref 80.0–100.0)
MONO ABS: 0.6 10*3/uL (ref 0.1–1.0)
MONOS PCT: 6 %
NEUTROS ABS: 6.3 10*3/uL (ref 1.7–7.7)
Neutrophils Relative %: 56 %
PLATELETS: 250 10*3/uL (ref 150–400)
RBC: 4.73 MIL/uL (ref 3.87–5.11)
RDW: 12.2 % (ref 11.5–15.5)
WBC: 10.9 10*3/uL — AB (ref 4.0–10.5)
nRBC: 0 % (ref 0.0–0.2)

## 2018-02-22 LAB — TROPONIN I

## 2018-02-22 LAB — COMPREHENSIVE METABOLIC PANEL
ALT: 22 U/L (ref 0–44)
AST: 15 U/L (ref 15–41)
Albumin: 3.8 g/dL (ref 3.5–5.0)
Alkaline Phosphatase: 62 U/L (ref 38–126)
Anion gap: 7 (ref 5–15)
BUN: 11 mg/dL (ref 6–20)
CHLORIDE: 109 mmol/L (ref 98–111)
CO2: 23 mmol/L (ref 22–32)
CREATININE: 0.83 mg/dL (ref 0.44–1.00)
Calcium: 8.8 mg/dL — ABNORMAL LOW (ref 8.9–10.3)
Glucose, Bld: 129 mg/dL — ABNORMAL HIGH (ref 70–99)
POTASSIUM: 3.5 mmol/L (ref 3.5–5.1)
SODIUM: 139 mmol/L (ref 135–145)
Total Bilirubin: 0.5 mg/dL (ref 0.3–1.2)
Total Protein: 7.2 g/dL (ref 6.5–8.1)

## 2018-02-22 LAB — D-DIMER, QUANTITATIVE: D-Dimer, Quant: 0.48 ug/mL-FEU (ref 0.00–0.50)

## 2018-02-22 LAB — LIPASE, BLOOD: LIPASE: 37 U/L (ref 11–51)

## 2018-02-22 MED ORDER — GI COCKTAIL ~~LOC~~
30.0000 mL | Freq: Once | ORAL | Status: AC
  Administered 2018-02-22: 30 mL via ORAL
  Filled 2018-02-22: qty 30

## 2018-02-22 NOTE — ED Provider Notes (Signed)
[facility] EMERGENCY DEPARTMENT Provider Note   CSN: [id number] Arrival date & time: 02/22/18  2256     History   Chief Complaint Chief Complaint  Patient presents with  . Chest Pain    HPI Jennifer Gonzales is a 37 y.o. female.  Patient with history of acid reflux, myalgia, migraine presenting with a 3-day history of constant central chest pain.  The pain is in the right side of her sternum and radiates to her back.  She reports the pain is constant, worse with movement of her torso and worse with deep breathing.  It is not exertional.  It does not radiate to her arm or neck.  There is some pain with breathing but no shortness of breath, cough, fever, vomiting, nausea, syncope or diaphoresis.  She is been taking Tylenol and ibuprofen at home without relief.  Denies having this pain in the past.  No history of cardiac issues.  She does take Depo birth control.  No history of DVT or PE.  She reports the pain is not worse with palpation but is worse when she twists her torso.  Lying flat makes it worse as well as sitting all the way up.  It is best at about 45 degrees elevation of her torso.  She states this does not feel like acid reflux.  The history is provided by the patient.  Chest Pain   Associated symptoms include shortness of breath. Pertinent negatives include no abdominal pain, no dizziness, no fever, no headaches, no nausea, no vomiting and no weakness.    Past Medical History:  Diagnosis Date  . Back pain   . BV (bacterial vaginosis)   . 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 Fibromyalgia   . Hx of migraines   . Mental disorder     Patient Active Problem List   Diagnosis Date Noted  . 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 Hyperlipemia 04/05/2015  . Esophageal reflux 04/05/2015  . Obesity, unspecified 04/05/2015  . Hx of migraines 07/28/2012  . Depression 07/28/2012  . Back pain  07/28/2012  . Fibromyalgia 07/28/2012  . BV (bacterial vaginosis) 07/28/2012    Past Surgical History:  Procedure Laterality Date  . BACK SURGERY    . CESAREAN SECTION    . CHOLECYSTECTOMY    . TONSILLECTOMY       OB History    Gravida  2   Para  2   Term  2   Preterm      AB      Living  2     SAB      TAB      Ectopic      Multiple      Live Births  2            Home Medications    Prior to Admission medications   Medication Sig Start Date End Date Taking? Authorizing Provider  cetirizine (ZYRTEC) 10 MG tablet Take 10 mg by mouth daily as needed for allergies.    [provider]  ibuprofen (ADVIL,MOTRIN) 200 MG tablet Take 800 mg by mouth daily as needed for mild pain or moderate pain.     [provider]  medroxyPROGESTERone (DEPO-PROVERA) 150 MG/ML injection use as directed at physician's office every 3 months 01/24/18   Adline Potter, NP  omeprazole (PRILOSEC) 40 MG capsule Take 40 mg by mouth daily.  11/27/15   [provider]  topiramate (TOPAMAX) 25 MG tablet Take by mouth. 07/17/17   [provider]    Family History Family History  Problem Relation Age of Onset  . Hypertension Other   . Diabetes Other   . Stroke Other   . Heart disease Other   . Cancer Paternal Grandmother        breast  . Diabetes Father   . Muscular dystrophy Mother   . Muscular dystrophy Brother   . Muscular dystrophy Sister   . Muscular dystrophy Sister   . Asthma Son   . Other Son        allergies    Social History Social History   Tobacco Use  . Smoking status: Current Every Day Smoker    Packs/day: 0.25    Years: 23.00    Pack years: 5.75    Types: Cigarettes  . Smokeless tobacco: Never Used  Substance Use Topics  . Alcohol use: Yes    Comment: occ  . Drug use: No     Allergies   Ketorolac tromethamine and Sulfa antibiotics   Review of Systems Review of Systems  Constitutional: Negative for activity  change, appetite change and fever.  HENT: Negative for congestion and rhinorrhea.   Eyes: Negative for visual disturbance.  Respiratory: Positive for chest tightness and shortness of breath.   Cardiovascular: Positive for chest pain. Negative for leg swelling.  Gastrointestinal: Negative for abdominal pain, nausea and vomiting.  Genitourinary: Negative for dysuria, hematuria, vaginal bleeding and vaginal discharge.  Musculoskeletal: Negative for arthralgias and myalgias.  Skin: Negative for rash.  Neurological: Negative for dizziness, weakness and headaches.    all other systems are negative except as noted in the HPI and PMH.    Physical Exam Updated Vital Signs BP 116/86 (BP Location: Left Arm)   Pulse 87   Temp 98.5 F (36.9 C) (Oral)   Resp 20   LMP  (LMP Unknown)   SpO2 98%   Physical Exam  Constitutional: She is oriented to person, place, and time. She appears well-developed and well-nourished. No distress.  HENT:  Head: Normocephalic and atraumatic.  Mouth/Throat: Oropharynx is clear and moist. No oropharyngeal exudate.  Eyes: Pupils are equal, round, and reactive to light. Conjunctivae and EOM are normal.  Neck: Normal range of motion. Neck supple.  No meningismus.  Cardiovascular: Normal rate, regular rhythm, normal heart sounds and intact distal pulses.  No murmur heard. Pulmonary/Chest: Effort normal and breath sounds normal. No respiratory distress.  Patient indicates chest pain is at right sternal border.  This spot is not tender to palpation Pain is worse with twisting of her torso  Abdominal: Soft. There is no tenderness. There is no rebound and no guarding.  Musculoskeletal: Normal range of motion. She exhibits no edema or tenderness.  Neurological: She is alert and oriented to person, place, and time. No cranial nerve deficit. She exhibits normal muscle tone. Coordination normal.  No ataxia on finger to nose bilaterally. No pronator drift. 5/5 strength  throughout. CN 2-12 intact.Equal grip strength. Sensation intact.   Skin: Skin is warm.  Psychiatric: She has a normal mood and affect. Her behavior is normal.  Nursing note and vitals reviewed.    ED Treatments / Results  Labs (all labs ordered are listed, but only abnormal results are displayed) Labs Reviewed  CBC WITH DIFFERENTIAL/PLATELET - Abnormal; Notable for the following components:      Result Value   WBC 10.9 (*)    Abs Immature  Granulocytes 0.08 (*)    All other components within normal limits  COMPREHENSIVE METABOLIC PANEL - Abnormal; Notable for the following components:   Glucose, Bld 129 (*)    Calcium 8.8 (*)    All other components within normal limits  LIPASE, BLOOD  TROPONIN I  D-DIMER, QUANTITATIVE (NOT AT [facility])    EKG EKG Interpretation  Date/Time:  Thursday February 22 2018 23:05:32 EDT Ventricular Rate:  92 PR Interval:    QRS Duration: 84 QT Interval:  350 QTC Calculation: 433 R Axis:   76 Text Interpretation:  Normal sinus rhythm Baseline wander in lead(s) V6 No significant change was found Confirmed by Glynn Octave [phone]) on 02/22/2018 11:18:37 PM   Radiology Dg Chest 2 View  Result Date: 02/23/2018 CLINICAL DATA:  Pleuritic chest pain for 12/10/2017 3 days EXAM: CHEST - 2 VIEW COMPARISON:  None. FINDINGS: Mild cardiomegaly with continued fine interstitial accentuation in the lungs, minimally improved from prior. No pleural effusion. Mild thoracic spondylosis. No overt airspace opacity. IMPRESSION: 1. Mild cardiomegaly with fine interstitial accentuation in the lungs. Although the interstitial accentuation is mildly improved, the overall pattern is nonspecific and may partially be from active smoking. Elective non emergent high-resolution chest CT could used to further characterize the lung parenchyma. 2. A specific cause for pleuritic chest pain is not identified. Electronically Signed   By: Gaylyn Rong M.D.   On: 02/23/2018 00:28     Procedures Procedures (including critical care time)  Medications Ordered in ED Medications  gi cocktail (Maalox,Lidocaine,Donnatal) (30 mLs Oral Given 02/22/18 2330)     Initial Impression / Assessment and Plan / ED Course  I have reviewed the triage vital signs and the nursing notes.  Pertinent labs & imaging results that were available during my care of the patient were reviewed by me and considered in my medical decision making (see chart for details).    3 days of constant central right-sided chest pain that is worse with twisting of the torso and worse with position change.  EKG shows sinus rhythm without acute ST changes.  Low suspicion for ACS.  Given pleuritic nature of pain and birth control use, d-dimer will be checked as well as chest x-ray.  D-dimer negative. Low suspicion for PE. Equal upper extremity BP, radial pulses, and grip strengths. Low suspicion for aortic dissection.  Discussed chest x-ray abnormality with patient and need for CT follow-up. Troponin negative in setting of 3 days of constant pain. EKG does not suggest pericarditis.   Low suspicion for ACS or pulmonary embolism.  Patient's pain is worse with movement of her torso and likely musculoskeletal in origin though it is not reproducible on exam.  Patient will be treated with anti-inflammatories.  Follow-up with PCP.  Return precautions discussed.  Final Clinical Impressions(s) / ED Diagnoses   Final diagnoses:  Atypical chest pain    ED Discharge Orders    None       Laretta Pyatt, Jeannett Senior, MD 02/23/18 [phone]

## 2018-02-22 NOTE — ED Triage Notes (Signed)
Pt c/o mid chest pain that is stabbing when she breathes in but dull al the time x 3 days. Pain radiates through to back. Non diaphoretic. Color wnl. Nad.

## 2018-02-23 MED ORDER — IBUPROFEN 800 MG PO TABS: 800.0000 mg | ORAL_TABLET | Freq: Three times a day (TID) | ORAL | 0 refills | Status: DC | PRN

## 2018-02-23 MED ORDER — IBUPROFEN 800 MG PO TABS
800.0000 mg | ORAL_TABLET | Freq: Once | ORAL | Status: AC
  Administered 2018-02-23: 800 mg via ORAL
  Filled 2018-02-23: qty 1

## 2018-02-23 MED ORDER — MORPHINE SULFATE (PF) 4 MG/ML IV SOLN
4.0000 mg | Freq: Once | INTRAVENOUS | Status: AC
  Administered 2018-02-23: 4 mg via INTRAVENOUS
  Filled 2018-02-23: qty 1

## 2018-02-23 NOTE — Discharge Instructions (Addendum)
Your testing today shows no suggestion of heart attack or blood clot in the lung.  No pneumonia.  You should follow-up with your primary doctor for a chest CT scan to further evaluate abnormality of your lungs that was seen on chest x-ray.  This is likely due to your smoking. Follow up with your doctor in 2 days. Take the anti-inflammatory as prescribed.  Return to the ED if you develop worsening symptoms including exertional chest pain, worsening shortness of breath, sweating, vomiting or other concerns.

## 2018-04-18 ENCOUNTER — Ambulatory Visit (INDEPENDENT_AMBULATORY_CARE_PROVIDER_SITE_OTHER): Payer: Commercial Managed Care - PPO

## 2018-04-18 VITALS — Ht 61.0 in | Wt 205.4 lb

## 2018-04-18 DIAGNOSIS — Z3202 Encounter for pregnancy test, result negative: Secondary | ICD-10-CM

## 2018-04-18 DIAGNOSIS — Z3042 Encounter for surveillance of injectable contraceptive: Secondary | ICD-10-CM | POA: Diagnosis not present

## 2018-04-18 LAB — POCT URINE PREGNANCY: PREG TEST UR: NEGATIVE

## 2018-04-18 MED ORDER — MEDROXYPROGESTERONE ACETATE 150 MG/ML IM SUSP
150.0000 mg | Freq: Once | INTRAMUSCULAR | Status: AC
  Administered 2018-04-18: 150 mg via INTRAMUSCULAR

## 2018-04-18 NOTE — Progress Notes (Signed)
Pt here for depo injection 150 mg IM given rt deltoid. Tolerated well . Return 12 weeks for next injection.pad CMA 

## 2018-07-12 ENCOUNTER — Ambulatory Visit (INDEPENDENT_AMBULATORY_CARE_PROVIDER_SITE_OTHER): Payer: Commercial Managed Care - PPO

## 2018-07-12 VITALS — Ht 59.0 in | Wt 211.4 lb

## 2018-07-12 DIAGNOSIS — Z3042 Encounter for surveillance of injectable contraceptive: Secondary | ICD-10-CM | POA: Diagnosis not present

## 2018-07-12 DIAGNOSIS — Z3202 Encounter for pregnancy test, result negative: Secondary | ICD-10-CM

## 2018-07-12 LAB — POCT URINE PREGNANCY: PREG TEST UR: NEGATIVE

## 2018-07-12 MED ORDER — MEDROXYPROGESTERONE ACETATE 150 MG/ML IM SUSP
150.0000 mg | Freq: Once | INTRAMUSCULAR | Status: AC
  Administered 2018-07-12: 150 mg via INTRAMUSCULAR

## 2018-07-12 NOTE — Progress Notes (Signed)
Pt here for depo injection 150 mg IM given lt deltoid. Tolerated well. Return 12 weeks for injection. Pad CMA

## 2018-10-04 ENCOUNTER — Ambulatory Visit (INDEPENDENT_AMBULATORY_CARE_PROVIDER_SITE_OTHER): Payer: Commercial Managed Care - PPO

## 2018-10-04 ENCOUNTER — Other Ambulatory Visit: Payer: Self-pay

## 2018-10-04 VITALS — Ht 59.0 in | Wt 219.0 lb

## 2018-10-04 DIAGNOSIS — Z3202 Encounter for pregnancy test, result negative: Secondary | ICD-10-CM | POA: Diagnosis not present

## 2018-10-04 DIAGNOSIS — Z3042 Encounter for surveillance of injectable contraceptive: Secondary | ICD-10-CM

## 2018-10-04 LAB — POCT URINE PREGNANCY: Preg Test, Ur: NEGATIVE

## 2018-10-04 MED ORDER — MEDROXYPROGESTERONE ACETATE 150 MG/ML IM SUSP
150.0000 mg | Freq: Once | INTRAMUSCULAR | Status: AC
  Administered 2018-10-04: 150 mg via INTRAMUSCULAR

## 2018-10-04 NOTE — Progress Notes (Signed)
Pt here for depo injection 150 mg IM given  Rt deltoid. Tolerated well. Return 12 weeks for next injection. Pad CMA 

## 2018-12-27 ENCOUNTER — Other Ambulatory Visit: Payer: Self-pay

## 2018-12-27 ENCOUNTER — Ambulatory Visit (INDEPENDENT_AMBULATORY_CARE_PROVIDER_SITE_OTHER): Payer: Commercial Managed Care - PPO

## 2018-12-27 VITALS — Ht 59.0 in | Wt 219.0 lb

## 2018-12-27 DIAGNOSIS — Z3042 Encounter for surveillance of injectable contraceptive: Secondary | ICD-10-CM | POA: Diagnosis not present

## 2018-12-27 MED ORDER — MEDROXYPROGESTERONE ACETATE 150 MG/ML IM SUSP
150.0000 mg | Freq: Once | INTRAMUSCULAR | Status: AC
  Administered 2018-12-27: 150 mg via INTRAMUSCULAR

## 2018-12-27 NOTE — Progress Notes (Signed)
Pt here for depo injection 150 mg IM given lt deltoid. Tolerated well.Return 12 weeks for next injection. Pad CMA 

## 2019-01-28 ENCOUNTER — Other Ambulatory Visit: Payer: 59 | Admitting: Adult Health

## 2019-02-23 ENCOUNTER — Emergency Department (HOSPITAL_COMMUNITY)
Admission: EM | Admit: 2019-02-23 | Discharge: 2019-02-23 | Disposition: A | Discharge status: Home / Self Care | Payer: Commercial Managed Care - PPO | Attending: Emergency Medicine | Admitting: Emergency Medicine

## 2019-02-23 ENCOUNTER — Other Ambulatory Visit: Payer: Self-pay

## 2019-02-23 ENCOUNTER — Encounter (HOSPITAL_COMMUNITY): Payer: Self-pay

## 2019-02-23 DIAGNOSIS — G43009 Migraine without aura, not intractable, without status migrainosus: Secondary | ICD-10-CM | POA: Diagnosis not present

## 2019-02-23 DIAGNOSIS — R519 Headache, unspecified: Secondary | ICD-10-CM | POA: Diagnosis present

## 2019-02-23 DIAGNOSIS — Z79899 Other long term (current) drug therapy: Secondary | ICD-10-CM | POA: Insufficient documentation

## 2019-02-23 DIAGNOSIS — F17210 Nicotine dependence, cigarettes, uncomplicated: Secondary | ICD-10-CM | POA: Insufficient documentation

## 2019-02-23 MED ORDER — METOCLOPRAMIDE HCL 5 MG/ML IJ SOLN
10.0000 mg | Freq: Once | INTRAMUSCULAR | Status: AC
  Administered 2019-02-23: 01:00:00 10 mg via INTRAVENOUS
  Filled 2019-02-23: qty 2

## 2019-02-23 MED ORDER — DEXAMETHASONE SODIUM PHOSPHATE 10 MG/ML IJ SOLN
10.0000 mg | Freq: Once | INTRAMUSCULAR | Status: AC
  Administered 2019-02-23: 10 mg via INTRAVENOUS
  Filled 2019-02-23: qty 1

## 2019-02-23 MED ORDER — DIPHENHYDRAMINE HCL 50 MG/ML IJ SOLN
25.0000 mg | Freq: Once | INTRAMUSCULAR | Status: AC
  Administered 2019-02-23: 25 mg via INTRAVENOUS
  Filled 2019-02-23: qty 1

## 2019-02-23 MED ORDER — SODIUM CHLORIDE 0.9 % IV BOLUS
1000.0000 mL | Freq: Once | INTRAVENOUS | Status: AC
  Administered 2019-02-23: 1000 mL via INTRAVENOUS

## 2019-02-23 NOTE — ED Triage Notes (Signed)
Pt arrives from home via POV c/o severe migraine brought on by her allergies. Pt reports this occurs annually and has not had any relief from at home treatments.

## 2019-02-23 NOTE — Discharge Instructions (Addendum)
Drink plenty of fluids, get plenty of rest.  Recheck if you get worse again.

## 2019-02-23 NOTE — ED Notes (Signed)
ED Provider at bedside. 

## 2019-02-23 NOTE — ED Provider Notes (Signed)
[facility]ERGENCY DEPARTMENT Provider Note   CSN: [id number] Arrival date & time: 02/23/19  0026   Time seen 12:45 AM  History   Chief Complaint Chief Complaint  Patient presents with  . Migraine    HPI Jennifer Gonzales is a 38 y.o. female.     HPI patient states she has a history of migraine headaches and she normally gets them when it is very hot outside for specially the months from July to November.  She reports that on October 8 she was having a lot of allergy symptoms with sneezing and she was having some pain over her maxillary sinuses.  She took some allergy medicine and the sneezing went away.  However today around 1 PM she started having her eyes get sensitive to light and she developed a holo-cranial headache that she describes as constant and throbbing and pulsating.  She describes photophobia and noise sensitivity but denies any acute visual changes.  She has had nausea without vomiting.  She denies any new numbness or tingling of her extremities.  She denies cough, fever, or sore throat.  She states she is never had to see a neurologist about her headaches.  She states she was placed on Topamax but it also helps with her migraine headaches.  PCP Patient, No Pcp [facility]   Past Medical History:  Diagnosis Date  . Back pain   . BV (bacterial vaginosis)   . 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 Fibromyalgia   . Hx of migraines   . Mental disorder     Patient Active Problem List   Diagnosis Date Noted  . 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 Hyperlipemia 04/05/2015  . Esophageal reflux 04/05/2015  . Obesity, unspecified 04/05/2015  . Hx of migraines 07/28/2012  . Depression 07/28/2012  . Back pain 07/28/2012  . Fibromyalgia 07/28/2012  . BV (bacterial vaginosis) 07/28/2012    Past Surgical History:  Procedure Laterality Date  . BACK  SURGERY    . CESAREAN SECTION    . CHOLECYSTECTOMY    . TONSILLECTOMY       OB History    Gravida  2   Para  2   Term  2   Preterm      AB      Living  2     SAB      TAB      Ectopic      Multiple      Live Births  2            Home Medications    Prior to Admission medications   Medication Sig Start Date End Date Taking? Authorizing Provider  cetirizine (ZYRTEC) 10 MG tablet Take 10 mg by mouth daily as needed for allergies.    [provider]  ibuprofen (ADVIL,MOTRIN) 800 MG tablet Take 1 tablet (800 mg total) by mouth every 8 (eight) hours as needed for moderate pain. Patient not taking: Reported on 04/18/2018 02/23/18   Glynn Octaveancour, Stephen, MD  medroxyPROGESTERone (DEPO-PROVERA) 150 MG/ML injection use as directed at physician's office every 3 months 01/24/18   Adline PotterGriffin, Jennifer A, NP  meloxicam (MOBIC) 7.5 MG tablet Take 7.5 mg by mouth daily.    [provider]  omeprazole (PRILOSEC) 40 MG capsule Take 40 mg by mouth daily.  11/27/15   [provider]  topiramate (TOPAMAX) 25  MG tablet Take by mouth. 07/17/17   [provider]    Family History Family History  Problem Relation Age of Onset  . Hypertension Other   . Diabetes Other   . Stroke Other   . Heart disease Other   . Cancer Paternal Grandmother        breast  . Diabetes Father   . Muscular dystrophy Mother   . Muscular dystrophy Brother   . Muscular dystrophy Sister   . Muscular dystrophy Sister   . Asthma Son   . Other Son        allergies    Social History Social History   Tobacco Use  . Smoking status: Current Every Day Smoker    Packs/day: 0.25    Years: 23.00    Pack years: 5.75    Types: Cigarettes  . Smokeless tobacco: Never Used  Substance Use Topics  . Alcohol use: Yes    Comment: occ  . Drug use: No  employed   Allergies   Ibuprofen, Ketorolac tromethamine, and Sulfa antibiotics   Review of Systems Review of Systems  All  other systems reviewed and are negative.    Physical Exam Updated Vital Signs BP 136/89 (BP Location: Left Arm)   Pulse 89   Temp 99.2 F (37.3 C) (Oral)   Resp 16   Ht 4' 11.5" (1.511 m)   Wt 99.8 kg   SpO2 98%   BMI 43.69 kg/m   Physical Exam Vitals signs and nursing note reviewed.  Constitutional:      General: She is not in acute distress.    Appearance: Normal appearance. She is well-developed. She is not ill-appearing or toxic-appearing.     Comments: Sitting in a well lit room on her cell phone and talks normally and does not appear to be in any distress  HENT:     Head: Normocephalic and atraumatic.     Right Ear: External ear normal.     Left Ear: External ear normal.     Nose: Nose normal. No mucosal edema or rhinorrhea.     Mouth/Throat:     Mouth: Mucous membranes are moist.     Dentition: No dental abscesses.     Pharynx: Oropharynx is clear. No oropharyngeal exudate, posterior oropharyngeal erythema or uvula swelling.  Eyes:     Extraocular Movements: Extraocular movements intact.     Conjunctiva/sclera: Conjunctivae normal.     Pupils: Pupils are equal, round, and reactive to light.  Neck:     Musculoskeletal: Full passive range of motion without pain, normal range of motion and neck supple.  Cardiovascular:     Rate and Rhythm: Normal rate and regular rhythm.     Heart sounds: Normal heart sounds. No murmur. No friction rub. No gallop.   Pulmonary:     Effort: Pulmonary effort is normal. No respiratory distress.     Breath sounds: Normal breath sounds. No wheezing, rhonchi or rales.  Chest:     Chest wall: No tenderness or crepitus.  Abdominal:     Tenderness: There is no rebound.  Musculoskeletal: Normal range of motion.        General: No tenderness.     Comments: Moves all extremities well.   Skin:    General: Skin is warm and dry.     Coloration: Skin is not pale.     Findings: No erythema or rash.  Neurological:     General: No focal  deficit present.  Mental Status: She is alert and oriented to person, place, and time.     Cranial Nerves: No cranial nerve deficit.  Psychiatric:        Mood and Affect: Mood normal. Mood is not anxious.        Speech: Speech normal.        Behavior: Behavior normal.        Thought Content: Thought content normal.      ED Treatments / Results  Labs (all labs ordered are listed, but only abnormal results are displayed) Labs Reviewed - No data to display  EKG None  Radiology No results found.  Procedures Procedures (including critical care time)  Medications Ordered in ED Medications  sodium chloride 0.9 % bolus 1,000 mL (1,000 mLs Intravenous New Bag/Given 02/23/19 0111)  metoCLOPramide (REGLAN) injection 10 mg (10 mg Intravenous Given 02/23/19 0112)  diphenhydrAMINE (BENADRYL) injection 25 mg (25 mg Intravenous Given 02/23/19 0113)  dexamethasone (DECADRON) injection 10 mg (10 mg Intravenous Given 02/23/19 0112)     Initial Impression / Assessment and Plan / ED Course  I have reviewed the triage vital signs and the nursing notes.  Pertinent labs & imaging results that were available during my care of the patient were reviewed by me and considered in my medical decision making (see chart for details).        Patient describes migraine headaches like she has had in the past.  There are no concerning warning signs to suggest she needs a CT scan or LP.  Patient states she cannot take Toradol because "it made me go crazy".  She was given migraine cocktail.  Recheck at 2:00 AM patient is standing in the room getting ready to walk to the bathroom.  She states her headache is much improved and she feels ready to be discharged.  Final Clinical Impressions(s) / ED Diagnoses   Final diagnoses:  Migraine without aura and without status migrainosus, not intractable    ED Discharge Orders    None     Plan discharge  Devoria Albe, MD, Concha Pyo, MD  02/23/19 [phone]

## 2019-03-21 ENCOUNTER — Ambulatory Visit (INDEPENDENT_AMBULATORY_CARE_PROVIDER_SITE_OTHER): Payer: Commercial Managed Care - PPO | Admitting: Adult Health

## 2019-03-21 ENCOUNTER — Ambulatory Visit: Payer: Commercial Managed Care - PPO

## 2019-03-21 ENCOUNTER — Other Ambulatory Visit (HOSPITAL_COMMUNITY)
Admission: RE | Admit: 2019-03-21 | Discharge: 2019-03-21 | Disposition: A | Discharge status: Home / Self Care | Payer: Commercial Managed Care - PPO | Source: Ambulatory Visit | Attending: Adult Health | Admitting: Adult Health

## 2019-03-21 ENCOUNTER — Other Ambulatory Visit: Payer: Self-pay

## 2019-03-21 ENCOUNTER — Encounter: Payer: Self-pay | Admitting: Adult Health

## 2019-03-21 VITALS — BP 134/81 | HR 91 | Ht 60.0 in | Wt 234.0 lb

## 2019-03-21 DIAGNOSIS — Z3042 Encounter for surveillance of injectable contraceptive: Secondary | ICD-10-CM

## 2019-03-21 DIAGNOSIS — Z01419 Encounter for gynecological examination (general) (routine) without abnormal findings: Secondary | ICD-10-CM | POA: Diagnosis present

## 2019-03-21 MED ORDER — MEDROXYPROGESTERONE ACETATE 150 MG/ML IM SUSP: INTRAMUSCULAR | 4 refills | Status: DC

## 2019-03-21 NOTE — Progress Notes (Signed)
Patient ID: Jennifer Gonzales, female   DOB: May 15, 1981, 38 y.o.   MRN: [id number] History of Present Illness: Jennifer Gonzales is a 38 year old white female, divorced, G2P2 in for a well woman gyn exam and pap, her last pap was 01/24/18 was +HPV.She has gained about 20 lbs she says since boyfriend broke up with her. She requests labs.   Current Medications, Allergies, Past Medical History, Past Surgical History, Family History and Social History were reviewed in [employer] record.     Review of Systems:  Patient denies any headaches, hearing loss, fatigue, blurred vision, shortness of breath, chest pain, abdominal pain, problems with bowel movements(has occasional constipation), urination, or intercourse. No joint pain or mood swings.   Physical Exam:BP 134/81 (BP Location: Left Arm, Patient Position: Sitting, Cuff Size: Large)   Pulse 91   Ht 5' (1.524 m)   Wt 234 lb (106.1 kg)   BMI 45.70 kg/m  General:  Well developed, well nourished, no acute distress Skin:  Warm and dry Neck:  Midline trachea, normal thyroid, good ROM, no lymphadenopathy Lungs; Clear to auscultation bilaterally Breast:  No dominant palpable mass, retraction, or nipple discharge Cardiovascular: Regular rate and rhythm Abdomen:  Soft, non tender, no hepatosplenomegaly Pelvic:  External genitalia is normal in appearance, no lesions.  The vagina is normal in appearance. Urethra has no lesions or masses. The cervix is bulbous and smooth, pap with GC/CHL and high risk HPV,16/18 genotyping performed  Uterus is felt to be normal size, shape, and contour.  No adnexal masses or tenderness noted.Bladder is non tender, no masses felt. Extremities/musculoskeletal:  No swelling or varicosities noted, no clubbing or cyanosis Psych:  No mood changes, alert and cooperative,seems happy Fall risk is low PHQ 2 score is 0. Co exam with Weyman Croon FNP student   Impression and plan: 1. Encounter for gynecological  examination with Papanicolaou smear of cervix Pap sent Physical in 1 year Pap in 3 if normal Fasting labs 03/25/2019, when gets depo  2. Encounter for surveillance of injectable contraceptive Meds ordered this encounter  Medications  . medroxyPROGESTERone (DEPO-PROVERA) 150 MG/ML injection    Sig: use as directed at physician's office every 3 months    Dispense:  1 mL    Refill:  4    Order Specific Question:   Supervising Provider    Answer:   Florian Buff [[id number]]  Return 11/9 for depo

## 2019-03-24 ENCOUNTER — Other Ambulatory Visit: Payer: Self-pay | Admitting: Adult Health

## 2019-03-24 DIAGNOSIS — Z3042 Encounter for surveillance of injectable contraceptive: Secondary | ICD-10-CM

## 2019-03-25 ENCOUNTER — Other Ambulatory Visit: Payer: Self-pay

## 2019-03-25 ENCOUNTER — Ambulatory Visit (INDEPENDENT_AMBULATORY_CARE_PROVIDER_SITE_OTHER): Payer: Commercial Managed Care - PPO | Admitting: *Deleted

## 2019-03-25 ENCOUNTER — Encounter: Payer: Self-pay | Admitting: *Deleted

## 2019-03-25 DIAGNOSIS — Z3202 Encounter for pregnancy test, result negative: Secondary | ICD-10-CM

## 2019-03-25 DIAGNOSIS — Z3042 Encounter for surveillance of injectable contraceptive: Secondary | ICD-10-CM | POA: Diagnosis not present

## 2019-03-25 DIAGNOSIS — Z308 Encounter for other contraceptive management: Secondary | ICD-10-CM

## 2019-03-25 LAB — POCT URINE PREGNANCY: Preg Test, Ur: NEGATIVE

## 2019-03-25 MED ORDER — MEDROXYPROGESTERONE ACETATE 150 MG/ML IM SUSP
150.0000 mg | Freq: Once | INTRAMUSCULAR | Status: AC
  Administered 2019-03-25: 150 mg via INTRAMUSCULAR

## 2019-03-25 NOTE — Progress Notes (Signed)
   NURSE VISIT- INJECTION  SUBJECTIVE:  Jennifer Gonzales is a 38 y.o. G51P2002 female here for a Depo Provera for contraception/period management. She is a GYN patient.   OBJECTIVE:  There were no vitals taken for this visit.  Appears well, in no apparent distress  Injection administered in: Right deltoid  Meds ordered this encounter  Medications  . medroxyPROGESTERone (DEPO-PROVERA) injection 150 mg    ASSESSMENT: GYN patient Depo Provera for contraception/period management  PLAN: Follow-up: in 11-13 weeks for next Depo   Levy Pupa  03/25/2019 10:36 AM

## 2019-03-26 ENCOUNTER — Other Ambulatory Visit: Payer: Self-pay | Admitting: Adult Health

## 2019-03-26 LAB — COMPREHENSIVE METABOLIC PANEL
ALT: 19 IU/L (ref 0–32)
AST: 18 IU/L (ref 0–40)
Albumin/Globulin Ratio: 1.7 (ref 1.2–2.2)
Albumin: 4.2 g/dL (ref 3.8–4.8)
Alkaline Phosphatase: 80 IU/L (ref 39–117)
BUN/Creatinine Ratio: 11 (ref 9–23)
BUN: 11 mg/dL (ref 6–20)
Bilirubin Total: 0.6 mg/dL (ref 0.0–1.2)
CO2: 21 mmol/L (ref 20–29)
Calcium: 8.9 mg/dL (ref 8.7–10.2)
Chloride: 104 mmol/L (ref 96–106)
Creatinine, Ser: 0.98 mg/dL (ref 0.57–1.00)
GFR calc Af Amer: 85 mL/min/{1.73_m2} (ref >59)
GFR calc non Af Amer: 73 mL/min/{1.73_m2} (ref >59)
Globulin, Total: 2.5 g/dL (ref 1.5–4.5)
Glucose: 83 mg/dL (ref 65–99)
Potassium: 4.2 mmol/L (ref 3.5–5.2)
Sodium: 139 mmol/L (ref 134–144)
Total Protein: 6.7 g/dL (ref 6.0–8.5)

## 2019-03-26 LAB — LIPID PANEL
Chol/HDL Ratio: 7 ratio — ABNORMAL HIGH (ref 0.0–4.4)
Cholesterol, Total: 188 mg/dL (ref 100–199)
HDL: 27 mg/dL — ABNORMAL LOW (ref >39)
LDL Chol Calc (NIH): 143 mg/dL — ABNORMAL HIGH (ref 0–99)
Triglycerides: 98 mg/dL (ref 0–149)
VLDL Cholesterol Cal: 18 mg/dL (ref 5–40)

## 2019-03-26 LAB — CBC
Hematocrit: 43.9 % (ref 34.0–46.6)
Hemoglobin: 14.8 g/dL (ref 11.1–15.9)
MCH: 30.6 pg (ref 26.6–33.0)
MCHC: 33.7 g/dL (ref 31.5–35.7)
MCV: 91 fL (ref 79–97)
Platelets: 271 10*3/uL (ref 150–450)
RBC: 4.84 x10E6/uL (ref 3.77–5.28)
RDW: 11.8 % (ref 11.7–15.4)
WBC: 11.3 10*3/uL — ABNORMAL HIGH (ref 3.4–10.8)

## 2019-03-26 LAB — TSH: TSH: 1.81 u[IU]/mL (ref 0.450–4.500)

## 2019-03-26 MED ORDER — SIMVASTATIN 20 MG PO TABS: 20.0000 mg | ORAL_TABLET | Freq: Every evening | ORAL | 6 refills | Status: DC

## 2019-03-26 NOTE — Progress Notes (Signed)
rx zocro for elevated cholesterol

## 2019-04-02 ENCOUNTER — Telehealth: Payer: Self-pay | Admitting: Adult Health

## 2019-04-02 ENCOUNTER — Encounter: Payer: Self-pay | Admitting: Adult Health

## 2019-04-02 DIAGNOSIS — R87810 Cervical high risk human papillomavirus (HPV) DNA test positive: Secondary | ICD-10-CM

## 2019-04-02 HISTORY — DX: Cervical high risk human papillomavirus (HPV) DNA test positive: R87.810

## 2019-04-02 LAB — CYTOLOGY - PAP
Chlamydia: NEGATIVE
Comment: NEGATIVE
Comment: NEGATIVE
Comment: NEGATIVE
Comment: NEGATIVE
Comment: NORMAL
Diagnosis: NEGATIVE
HPV 16: NEGATIVE
HPV 18 / 45: POSITIVE — AB
High risk HPV: POSITIVE — AB
Neisseria Gonorrhea: NEGATIVE

## 2019-04-02 NOTE — Telephone Encounter (Signed)
Left message that pap was negative for malignancy and HPV 16 and GC/CHL but +HPV 18 needs colpo. Call for appt with MD

## 2019-04-22 ENCOUNTER — Ambulatory Visit (INDEPENDENT_AMBULATORY_CARE_PROVIDER_SITE_OTHER): Payer: Commercial Managed Care - PPO | Admitting: Obstetrics & Gynecology

## 2019-04-22 ENCOUNTER — Other Ambulatory Visit: Payer: Self-pay

## 2019-04-22 ENCOUNTER — Encounter: Payer: Self-pay | Admitting: Obstetrics & Gynecology

## 2019-04-22 VITALS — BP 121/75 | HR 87 | Ht 60.0 in | Wt 132.4 lb

## 2019-04-22 DIAGNOSIS — Z3202 Encounter for pregnancy test, result negative: Secondary | ICD-10-CM

## 2019-04-22 DIAGNOSIS — R87810 Cervical high risk human papillomavirus (HPV) DNA test positive: Secondary | ICD-10-CM

## 2019-04-22 LAB — POCT URINE PREGNANCY: Preg Test, Ur: NEGATIVE

## 2019-04-22 NOTE — Progress Notes (Signed)
Colposcopy Procedure Note:  Colposcopy Procedure Note  Indications:  2020:  Cytology: negative +HPV18-HPV16 2019:  Cytology: negative +HR HPV 2018:  Cytology: negative +HR HPV Colposcopy 2019 normal Smoker:  Yes.   New sexual partner:  No.      Procedure Details  The risks and benefits of the procedure and Written informed consent obtained.  Speculum placed in vagina and excellent visualization of cervix achieved, cervix swabbed x 3 with acetic acid solution.  Findings: Cervix: no visible lesions, no mosaicism, no punctation and no abnormal vasculature; SCJ visualized 360 degrees without lesions and no biopsies taken. Vaginal inspection: normal without visible lesions. Vulvar colposcopy: vulvar colposcopy not performed.  Specimens: none  Complications: none.  Plan: Recommend HPV based cytology in 1 year

## 2019-06-21 ENCOUNTER — Other Ambulatory Visit: Payer: Self-pay

## 2019-06-21 ENCOUNTER — Ambulatory Visit (INDEPENDENT_AMBULATORY_CARE_PROVIDER_SITE_OTHER): Payer: Commercial Managed Care - PPO | Admitting: *Deleted

## 2019-06-21 DIAGNOSIS — Z3042 Encounter for surveillance of injectable contraceptive: Secondary | ICD-10-CM | POA: Diagnosis not present

## 2019-06-21 MED ORDER — MEDROXYPROGESTERONE ACETATE 150 MG/ML IM SUSP
150.0000 mg | Freq: Once | INTRAMUSCULAR | Status: AC
  Administered 2019-06-21: 10:00:00 150 mg via INTRAMUSCULAR

## 2019-06-21 NOTE — Progress Notes (Signed)
   NURSE VISIT- INJECTION  SUBJECTIVE:  Jennifer Gonzales is a 39 y.o. G2P2002 female here for a Depo Provera for contraception/period management. She is a GYN patient.   OBJECTIVE:  There were no vitals taken for this visit.  Appears well, in no apparent distress  Injection administered in: Left deltoid  No orders of the defined types were placed in this encounter.   ASSESSMENT: GYN patient Depo Provera for contraception/period management PLAN: Follow-up: in 11-13 weeks for next Depo   Rash, Faith Rogue  06/21/2019 9:29 AM

## 2019-09-13 ENCOUNTER — Ambulatory Visit (INDEPENDENT_AMBULATORY_CARE_PROVIDER_SITE_OTHER): Payer: Commercial Managed Care - PPO | Admitting: *Deleted

## 2019-09-13 ENCOUNTER — Encounter: Payer: Self-pay | Admitting: *Deleted

## 2019-09-13 ENCOUNTER — Other Ambulatory Visit: Payer: Self-pay

## 2019-09-13 DIAGNOSIS — Z308 Encounter for other contraceptive management: Secondary | ICD-10-CM

## 2019-09-13 DIAGNOSIS — Z3042 Encounter for surveillance of injectable contraceptive: Secondary | ICD-10-CM | POA: Diagnosis not present

## 2019-09-13 MED ORDER — MEDROXYPROGESTERONE ACETATE 150 MG/ML IM SUSP
150.0000 mg | Freq: Once | INTRAMUSCULAR | Status: AC
  Administered 2019-09-13: 150 mg via INTRAMUSCULAR

## 2019-09-13 NOTE — Progress Notes (Signed)
   NURSE VISIT- INJECTION  SUBJECTIVE:  Jennifer Gonzales is a 39 y.o. G62P2002 female here for a Depo Provera for contraception/period management. She is a GYN patient.   OBJECTIVE:  There were no vitals taken for this visit.  Appears well, in no apparent distress  Injection administered in: Right deltoid  Meds ordered this encounter  Medications  . medroxyPROGESTERone (DEPO-PROVERA) injection 150 mg    ASSESSMENT: GYN patient Depo Provera for contraception/period management PLAN: Follow-up: in 11-13 weeks for next Depo   Malachy Mood  09/13/2019 9:58 AM

## 2019-09-24 ENCOUNTER — Encounter (HOSPITAL_COMMUNITY): Payer: Self-pay

## 2019-09-24 ENCOUNTER — Other Ambulatory Visit: Payer: Self-pay

## 2019-09-24 ENCOUNTER — Emergency Department (HOSPITAL_COMMUNITY)
Admission: EM | Admit: 2019-09-24 | Discharge: 2019-09-25 | Disposition: A | Discharge status: Home / Self Care | Payer: Commercial Managed Care - PPO | Attending: Emergency Medicine | Admitting: Emergency Medicine

## 2019-09-24 ENCOUNTER — Emergency Department (HOSPITAL_COMMUNITY): Payer: Commercial Managed Care - PPO

## 2019-09-24 DIAGNOSIS — J209 Acute bronchitis, unspecified: Secondary | ICD-10-CM | POA: Insufficient documentation

## 2019-09-24 DIAGNOSIS — Z79899 Other long term (current) drug therapy: Secondary | ICD-10-CM | POA: Insufficient documentation

## 2019-09-24 DIAGNOSIS — R05 Cough: Secondary | ICD-10-CM | POA: Insufficient documentation

## 2019-09-24 DIAGNOSIS — Z20822 Contact with and (suspected) exposure to covid-19: Secondary | ICD-10-CM | POA: Insufficient documentation

## 2019-09-24 DIAGNOSIS — F17210 Nicotine dependence, cigarettes, uncomplicated: Secondary | ICD-10-CM | POA: Insufficient documentation

## 2019-09-24 DIAGNOSIS — R509 Fever, unspecified: Secondary | ICD-10-CM | POA: Diagnosis not present

## 2019-09-24 DIAGNOSIS — R0981 Nasal congestion: Secondary | ICD-10-CM | POA: Diagnosis present

## 2019-09-24 LAB — BASIC METABOLIC PANEL
Anion gap: 8 (ref 5–15)
BUN: 13 mg/dL (ref 6–20)
CO2: 26 mmol/L (ref 22–32)
Calcium: 8.8 mg/dL — ABNORMAL LOW (ref 8.9–10.3)
Chloride: 105 mmol/L (ref 98–111)
Creatinine, Ser: 0.9 mg/dL (ref 0.44–1.00)
GFR calc Af Amer: >60 mL/min (ref >60)
GFR calc non Af Amer: >60 mL/min (ref >60)
Glucose, Bld: 155 mg/dL — ABNORMAL HIGH (ref 70–99)
Potassium: 4.1 mmol/L (ref 3.5–5.1)
Sodium: 139 mmol/L (ref 135–145)

## 2019-09-24 LAB — CBC WITH DIFFERENTIAL/PLATELET
Abs Immature Granulocytes: 0.13 10*3/uL — ABNORMAL HIGH (ref 0.00–0.07)
Basophils Absolute: 0.1 10*3/uL (ref 0.0–0.1)
Basophils Relative: 0 %
Eosinophils Absolute: 0.3 10*3/uL (ref 0.0–0.5)
Eosinophils Relative: 2 %
HCT: 45.9 % (ref 36.0–46.0)
Hemoglobin: 15 g/dL (ref 12.0–15.0)
Immature Granulocytes: 1 %
Lymphocytes Relative: 20 %
Lymphs Abs: 2.5 10*3/uL (ref 0.7–4.0)
MCH: 31.2 pg (ref 26.0–34.0)
MCHC: 32.7 g/dL (ref 30.0–36.0)
MCV: 95.4 fL (ref 80.0–100.0)
Monocytes Absolute: 0.7 10*3/uL (ref 0.1–1.0)
Monocytes Relative: 6 %
Neutro Abs: 9 10*3/uL — ABNORMAL HIGH (ref 1.7–7.7)
Neutrophils Relative %: 71 %
Platelets: 276 10*3/uL (ref 150–400)
RBC: 4.81 MIL/uL (ref 3.87–5.11)
RDW: 12.3 % (ref 11.5–15.5)
WBC: 12.7 10*3/uL — ABNORMAL HIGH (ref 4.0–10.5)
nRBC: 0 % (ref 0.0–0.2)

## 2019-09-24 LAB — SARS CORONAVIRUS 2 BY RT PCR (HOSPITAL ORDER, PERFORMED IN ~~LOC~~ HOSPITAL LAB): SARS Coronavirus 2: NEGATIVE

## 2019-09-24 MED ORDER — ALBUTEROL SULFATE HFA 108 (90 BASE) MCG/ACT IN AERS
2.0000 | INHALATION_SPRAY | Freq: Four times a day (QID) | RESPIRATORY_TRACT | 0 refills | Status: DC | PRN
Start: 2019-09-24 — End: 2023-06-30

## 2019-09-24 NOTE — ED Provider Notes (Signed)
[facility] EMERGENCY DEPARTMENT Provider Note   CSN: [id number] Arrival date & time: 09/24/19  2156     History Chief Complaint  Patient presents with  . Cough    Jennifer Gonzales is a 39 y.o. female.  HPI  39 year old female with a history of seasonal allergies, migraines, depression, fibromyalgia, hyperlipidemia, chronic smoking presents to the ER with nasal congestion, cough, fever up to 100.3.  Symptoms began on Friday with what the patient refers was allergies from working outside.  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  She was seen by telehealth doc yesterday and started on ipratropium, steroid pack and montelukast for allergic sinusitis.  Today the patient states that her nasal congestion has cleared but she now has developed a nonproductive cough and continuing to have a sore throat. She refers a self-reported fever as high as 100.3.  She denies any nausea, vomiting, abdominal pain, chest pain, shortness of breath, headache, back pain, dysuria, hematuria, blood in stool.  She reports smoking 5 to 8 cigarettes a day.      Past Medical History:  Diagnosis Date  . Back pain   . BV (bacterial vaginosis)   . 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 Fibromyalgia   . Hx of migraines   . Mental disorder   . Papanicolaou smear of cervix with positive high risk human papilloma virus (HPV) test 04/02/2019   03/2019, +HPV 18 needs colpo___  . Vaginal Pap smear, abnormal     Patient Active Problem List   Diagnosis Date Noted  . Papanicolaou smear of cervix with positive high risk human papilloma virus (HPV) test 04/02/2019  . 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 Hyperlipemia 04/05/2015  . Esophageal reflux 04/05/2015  . Obesity, unspecified 04/05/2015  . Hx of migraines 07/28/2012  . Depression  07/28/2012  . Back pain 07/28/2012  . Fibromyalgia 07/28/2012  . BV (bacterial vaginosis) 07/28/2012    Past Surgical History:  Procedure Laterality Date  . BACK SURGERY    . CESAREAN SECTION    . CHOLECYSTECTOMY    . TONSILLECTOMY       OB History    Gravida  2   Para  2   Term  2   Preterm      AB      Living  2     SAB      TAB      Ectopic      Multiple      Live Births  2           Family History  Problem Relation Age of Onset  . Hypertension Other   . Diabetes Other   . Stroke Other   . Heart disease Other   . Cancer Paternal Grandmother        breast  . Diabetes Father   . Muscular dystrophy Mother   . Muscular dystrophy Brother   . Muscular dystrophy Sister   . Muscular dystrophy Sister   . Asthma Son   . Other Son        allergies    Social History   Tobacco Use  . Smoking status: Current Every Day Smoker    Packs/day: 0.25    Years: 23.00    Pack years: 5.75    Types: Cigarettes  . Smokeless tobacco: Never  Used  Substance Use Topics  . Alcohol use: Yes    Comment: occ  . Drug use: No    Home Medications Prior to Admission medications   Medication Sig Start Date End Date Taking? Authorizing Provider  ipratropium (ATROVENT) 0.03 % nasal spray Place 2 sprays into both nostrils 3 (three) times daily.  09/24/19  Yes [provider]  lansoprazole (PREVACID) 30 MG capsule Take 30 mg by mouth daily at 12 noon.   Yes [provider]  medroxyPROGESTERone Acetate 150 MG/ML SUSY USE AS DIRECTED AT PHYSICIANS OFFICE EVERY 3 MONTHS Patient taking differently: Inject 150 mg into the muscle every 3 (three) months.  03/25/19  Yes Cyril Mourning A, NP  methylPREDNISolone (MEDROL DOSEPAK) 4 MG TBPK tablet Take 4-24 mg by mouth See admin instructions. 6 day course starting on 09/23/2019 09/23/19  Yes [provider]  montelukast (SINGULAIR) 10 MG tablet Take 10 mg by mouth at bedtime.  09/23/19  Yes [provider]  Olopatadine HCl 0.2 % SOLN Apply 1 drop to eye daily as needed (FOR ALLERGY EYE RELIEF).  09/24/19  Yes [provider]  topiramate (TOPAMAX) 25 MG tablet Take 25 mg by mouth at bedtime.  07/17/17  Yes [provider]  albuterol (VENTOLIN HFA) 108 (90 Base) MCG/ACT inhaler Inhale 2 puffs into the lungs every 6 (six) hours as needed for wheezing or shortness of breath. 09/24/19   Mare Ferrari, PA-C  cetirizine (ZYRTEC) 10 MG tablet Take 10 mg by mouth daily as needed for allergies.    [provider]    Allergies    Ibuprofen, Ketorolac tromethamine, and Sulfa antibiotics  Review of Systems   Review of Systems  Constitutional: Negative for activity change, appetite change, chills, fatigue and fever.  HENT: Positive for congestion, rhinorrhea and sore throat. Negative for ear pain, trouble swallowing and voice change.   Eyes: Negative for pain and visual disturbance.  Respiratory: Positive for cough. Negative for shortness of breath.   Cardiovascular: Negative for chest pain and palpitations.  Gastrointestinal: Negative for abdominal pain, diarrhea, nausea and vomiting.  Genitourinary: Negative for dysuria and hematuria.  Musculoskeletal: Negative for arthralgias and back pain.  Skin: Negative for color change and rash.  Neurological: Negative for dizziness, seizures, syncope and headaches.  All other systems reviewed and are negative.   Physical Exam Updated Vital Signs BP 136/69 (BP Location: Left Arm)   Pulse 96   Temp 98 F (36.7 C) (Oral)   Resp 16   Ht 4' 11.5" (1.511 m)   Wt 99.8 kg   SpO2 98%   BMI 43.69 kg/m   Physical Exam Vitals and nursing note reviewed.  Constitutional:      General: She is not in acute distress.    Appearance: She is well-developed. She is obese. She is not ill-appearing, toxic-appearing or diaphoretic.  HENT:     Head: Normocephalic and atraumatic.     Right Ear: Tympanic membrane normal.     Left  Ear: Tympanic membrane normal.     Mouth/Throat:     Mouth: Mucous membranes are moist.     Pharynx: Oropharynx is clear. No oropharyngeal exudate or posterior oropharyngeal erythema.  Eyes:     Extraocular Movements: Extraocular movements intact.     Conjunctiva/sclera: Conjunctivae normal.     Pupils: Pupils are equal, round, and reactive to light.  Cardiovascular:     Rate and Rhythm: Normal rate and regular rhythm.     Pulses: Normal  pulses.     Heart sounds: Normal heart sounds. No murmur.  Pulmonary:     Effort: Pulmonary effort is normal. No respiratory distress.     Breath sounds: Wheezing (Mild expiratory wheezes) present.  Abdominal:     Palpations: Abdomen is soft.     Tenderness: There is no abdominal tenderness.  Musculoskeletal:        General: No swelling or tenderness. Normal range of motion.     Cervical back: Normal range of motion and neck supple. No tenderness.  Skin:    General: Skin is warm and dry.     Findings: No erythema or rash.  Neurological:     General: No focal deficit present.     Mental Status: She is alert and oriented to person, place, and time.  Psychiatric:        Mood and Affect: Mood normal.        Behavior: Behavior normal.     ED Results / Procedures / Treatments   Labs (all labs ordered are listed, but only abnormal results are displayed) Labs Reviewed  CBC WITH DIFFERENTIAL/PLATELET - Abnormal; Notable for the following components:      Result Value   WBC 12.7 (*)    Neutro Abs 9.0 (*)    Abs Immature Granulocytes 0.13 (*)    All other components within normal limits  BASIC METABOLIC PANEL - Abnormal; Notable for the following components:   Glucose, Bld 155 (*)    Calcium 8.8 (*)    All other components within normal limits  SARS CORONAVIRUS 2 BY RT PCR (HOSPITAL ORDER, PERFORMED IN [facility])    EKG None  Radiology DG Chest Portable 1 View  Result Date: 09/24/2019 CLINICAL DATA:  Cough and fever. EXAM:  PORTABLE CHEST 1 VIEW COMPARISON:  February 22, 2018 FINDINGS: Mild, chronic appearing increased lung markings are seen without evidence of acute infiltrate, pleural effusion or pneumothorax. The heart size and mediastinal contours are within normal limits. The visualized skeletal structures are unremarkable. IMPRESSION: Chronic appearing increased lung markings without evidence of acute or active cardiopulmonary disease. Electronically Signed   By: Aram Candela M.D.   On: 09/24/2019 23:16    Procedures Procedures (including critical care time)  Medications Ordered in ED Medications - No data to display  ED Course  I have reviewed the triage vital signs and the nursing notes.  Pertinent labs & imaging results that were available during my care of the patient were reviewed by me and considered in my medical decision making (see chart for details).    MDM Rules/Calculators/A&P                     39 year old female with cough, sore throat, questionable fever since Friday On presentation to the ER, patient is alert and oriented, in no acute distress, nontoxic appearing, without evidence of increased work of breathing, speaking full sentences.  Vitals overall reassuring, patient is afebrile in the ER, mildly hypertensive which seems to be consistent with her previous ED visits.  Mildly tachycardic on presentation, which improved throughout ED course. O2 Sats 99%.  CBC with mild leukocytosis of 12.7, normal hemoglobin.  Patient appears to have a history of chronically mildly elevated leukocytosis.  I personally reviewed and interpreted the xray which was without acute cardiopulmonary findings, consistent with chronic appearing lung markings.  2-hour PCR Covid test negative.  Clinical picture consistent with possible acute bronchitis/viral URI.  I do not think antibiotic management  is indicated at this time and this was expressed to the patient.  Mild wheezes on exam, will prescribe albuterol  inhaler on top of already prescribed management by the telehealth.  Encourage patient to continue current regimen, and combination with OTC Mucinex for decongestion.  Patient voices understanding and is agreeable to the above plan.  Strict return precautions given.  Patient voices understanding is agreeable to this plan.  Encouraged PCP follow-up if symptoms do not improve, possible need for further management of chronic allergy symptoms.  Final Clinical Impression(s) / ED Diagnoses Final diagnoses:  Acute bronchitis, unspecified organism    Rx / DC Orders ED Discharge Orders         Ordered    albuterol (VENTOLIN HFA) 108 (90 Base) MCG/ACT inhaler  Every 6 hours PRN     09/24/19 2337           Leone Brand 09/25/19 Lorin Picket    Eber Hong, MD 09/25/19 1450

## 2019-09-24 NOTE — ED Triage Notes (Signed)
Pt presents to ED with complaints of nasal congestion, cough, and fever up to 100.3. Pt denies covid exposure.

## 2019-09-24 NOTE — Discharge Instructions (Addendum)
Your work-up overall today was reassuring.  Your chest x-ray did not show evidence of a pneumonia.  Continue with prescribed medication regimen.  I have added a albuterol inhaler for your wheezing.  Take over-the-counter Mucinex extended release or DM should work just fine.  Return to the ER if your symptoms worsen.  Follow-up with your PCP if your symptoms do not improve.

## 2019-12-06 ENCOUNTER — Ambulatory Visit (INDEPENDENT_AMBULATORY_CARE_PROVIDER_SITE_OTHER): Payer: Commercial Managed Care - PPO | Admitting: *Deleted

## 2019-12-06 DIAGNOSIS — Z3042 Encounter for surveillance of injectable contraceptive: Secondary | ICD-10-CM | POA: Diagnosis not present

## 2019-12-06 MED ORDER — MEDROXYPROGESTERONE ACETATE 150 MG/ML IM SUSP
150.0000 mg | Freq: Once | INTRAMUSCULAR | Status: AC
  Administered 2019-12-06: 150 mg via INTRAMUSCULAR

## 2019-12-06 NOTE — Progress Notes (Signed)
   NURSE VISIT- INJECTION  SUBJECTIVE:  Jennifer Gonzales is a 39 y.o. G46P2002 female here for a Depo Provera for contraception/period management. She is a GYN patient.   OBJECTIVE:  There were no vitals taken for this visit.  Appears well, in no apparent distress  Injection administered in: Left deltoid  No orders of the defined types were placed in this encounter.   ASSESSMENT: GYN patient Depo Provera for contraception/period management PLAN: Follow-up: in 11-13 weeks for next Depo   Stoney Bang  12/06/2019 10:13 AM

## 2020-02-28 ENCOUNTER — Encounter: Payer: Self-pay | Admitting: *Deleted

## 2020-02-28 ENCOUNTER — Ambulatory Visit (INDEPENDENT_AMBULATORY_CARE_PROVIDER_SITE_OTHER): Payer: Commercial Managed Care - PPO | Admitting: *Deleted

## 2020-02-28 DIAGNOSIS — Z3042 Encounter for surveillance of injectable contraceptive: Secondary | ICD-10-CM

## 2020-02-28 MED ORDER — MEDROXYPROGESTERONE ACETATE 150 MG/ML IM SUSP
150.0000 mg | Freq: Once | INTRAMUSCULAR | Status: AC
  Administered 2020-02-28: 150 mg via INTRAMUSCULAR

## 2020-02-28 NOTE — Progress Notes (Signed)
   NURSE VISIT- INJECTION  SUBJECTIVE:  Jennifer Gonzales is a 39 y.o. G60P2002 female here for a Depo Provera for contraception/period management. She is a GYN patient.   OBJECTIVE:  There were no vitals taken for this visit.  Appears well, in no apparent distress  Injection administered in: Right deltoid  Meds ordered this encounter  Medications  . medroxyPROGESTERone (DEPO-PROVERA) injection 150 mg    ASSESSMENT: GYN patient Depo Provera for contraception/period management PLAN: Follow-up: in 11-13 weeks for next Depo   Shaleena Crusoe  02/28/2020 10:14 AM

## 2020-03-27 ENCOUNTER — Other Ambulatory Visit: Payer: Commercial Managed Care - PPO | Admitting: Adult Health

## 2020-04-24 ENCOUNTER — Ambulatory Visit (INDEPENDENT_AMBULATORY_CARE_PROVIDER_SITE_OTHER): Payer: Commercial Managed Care - PPO | Admitting: Adult Health

## 2020-04-24 ENCOUNTER — Encounter: Payer: Self-pay | Admitting: Adult Health

## 2020-04-24 ENCOUNTER — Other Ambulatory Visit: Payer: Self-pay

## 2020-04-24 ENCOUNTER — Other Ambulatory Visit (HOSPITAL_COMMUNITY)
Admission: RE | Admit: 2020-04-24 | Discharge: 2020-04-24 | Disposition: A | Discharge status: Home / Self Care | Payer: Commercial Managed Care - PPO | Source: Ambulatory Visit | Attending: Adult Health | Admitting: Adult Health

## 2020-04-24 VITALS — BP 146/92 | HR 87 | Ht 59.5 in | Wt 235.0 lb

## 2020-04-24 DIAGNOSIS — R232 Flushing: Secondary | ICD-10-CM

## 2020-04-24 DIAGNOSIS — Z3042 Encounter for surveillance of injectable contraceptive: Secondary | ICD-10-CM

## 2020-04-24 DIAGNOSIS — Z720 Tobacco use: Secondary | ICD-10-CM | POA: Insufficient documentation

## 2020-04-24 DIAGNOSIS — Z01419 Encounter for gynecological examination (general) (routine) without abnormal findings: Secondary | ICD-10-CM | POA: Diagnosis present

## 2020-04-24 DIAGNOSIS — R635 Abnormal weight gain: Secondary | ICD-10-CM | POA: Diagnosis not present

## 2020-04-24 DIAGNOSIS — F17200 Nicotine dependence, unspecified, uncomplicated: Secondary | ICD-10-CM

## 2020-04-24 MED ORDER — MEDROXYPROGESTERONE ACETATE 150 MG/ML IM SUSY: PREFILLED_SYRINGE | INTRAMUSCULAR | 3 refills | Status: DC

## 2020-04-24 MED ORDER — CALCIUM CARBONATE 600 MG PO TABS: ORAL_TABLET | ORAL | Status: DC

## 2020-04-24 MED ORDER — CHOLECALCIFEROL 125 MCG (5000 UT) PO CAPS
5000.0000 [IU] | ORAL_CAPSULE | Freq: Every day | ORAL | Status: DC
Start: 2020-04-24 — End: 2021-04-23

## 2020-04-24 NOTE — Patient Instructions (Addendum)
Take vitamin D 3 5 Menopause Menopause is the normal time of life when menstrual periods stop completely. It is usually confirmed by 12 months without a menstrual period. The transition to menopause (perimenopause) most often happens between the ages of 29 and 15. During perimenopause, hormone levels change in your body, which can cause symptoms and affect your health. Menopause may increase your risk for:  Loss of bone (osteoporosis), which causes bone breaks (fractures).  Depression.  Hardening and narrowing of the arteries (atherosclerosis), which can cause heart attacks and strokes. What are the causes? This condition is usually caused by a natural change in hormone levels that happens as you get older. The condition may also be caused by surgery to remove both ovaries (bilateral oophorectomy). What increases the risk? This condition is more likely to start at an earlier age if you have certain medical conditions or treatments, including:  A tumor of the pituitary gland in the brain.  A disease that affects the ovaries and hormone production.  Radiation treatment for cancer.  Certain cancer treatments, such as chemotherapy or hormone (anti-estrogen) therapy.  Heavy smoking and excessive alcohol use.  Family history of early menopause. This condition is also more likely to develop earlier in women who are very thin. What are the signs or symptoms? Symptoms of this condition include:  Hot flashes.  Irregular menstrual periods.  Night sweats.  Changes in feelings about sex. This could be a decrease in sex drive or an increased comfort around your sexuality.  Vaginal dryness and thinning of the vaginal walls. This may cause painful intercourse.  Dryness of the skin and development of wrinkles.  Headaches.  Problems sleeping (insomnia).  Mood swings or irritability.  Memory problems.  Weight gain.  Hair growth on the face and chest.  Bladder infections or problems  with urinating. How is this diagnosed? This condition is diagnosed based on your medical history, a physical exam, your age, your menstrual history, and your symptoms. Hormone tests may also be done. How is this treated? In some cases, no treatment is needed. You and your health care provider should make a decision together about whether treatment is necessary. Treatment will be based on your individual condition and preferences. Treatment for this condition focuses on managing symptoms. Treatment may include:  Menopausal hormone therapy (MHT).  Medicines to treat specific symptoms or complications.  Acupuncture.  Vitamin or herbal supplements. Before starting treatment, make sure to let your health care provider know if you have a personal or family history of:  Heart disease.  Breast cancer.  Blood clots.  Diabetes.  Osteoporosis. Follow these instructions at home: Lifestyle  Do not use any products that contain nicotine or tobacco, such as cigarettes and e-cigarettes. If you need help quitting, ask your health care provider.  Get at least 30 minutes of physical activity on 5 or more days each week.  Avoid alcoholic and caffeinated beverages, as well as spicy foods. This may help prevent hot flashes.  Get 7-8 hours of sleep each night.  If you have hot flashes, try: ? Dressing in layers. ? Avoiding things that may trigger hot flashes, such as spicy food, warm places, or stress. ? Taking slow, deep breaths when a hot flash starts. ? Keeping a fan in your home and office.  Find ways to manage stress, such as deep breathing, meditation, or journaling.  Consider going to group therapy with other women who are having menopause symptoms. Ask your health care provider about recommended  group therapy meetings. Eating and drinking  Eat a healthy, balanced diet that contains whole grains, lean protein, low-fat dairy, and plenty of fruits and vegetables.  Your health care  provider may recommend adding more soy to your diet. Foods that contain soy include tofu, tempeh, and soy milk.  Eat plenty of foods that contain calcium and vitamin D for bone health. Items that are rich in calcium include low-fat milk, yogurt, beans, almonds, sardines, broccoli, and kale. Medicines  Take over-the-counter and prescription medicines only as told by your health care provider.  Talk with your health care provider before starting any herbal supplements. If prescribed, take vitamins and supplements as told by your health care provider. These may include: ? Calcium. Women age 43 and older should get 1,200 mg (milligrams) of calcium every day. ? Vitamin D. Women need 600-800 International Units of vitamin D each day. ? Vitamins B12 and B6. Aim for 50 micrograms of B12 and 1.5 mg of B6 each day. General instructions  Keep track of your menstrual periods, including: ? When they occur. ? How heavy they are and how long they last. ? How much time passes between periods.  Keep track of your symptoms, noting when they start, how often you have them, and how long they last.  Use vaginal lubricants or moisturizers to help with vaginal dryness and improve comfort during sex.  Keep all follow-up visits as told by your health care provider. This is important. This includes any group therapy or counseling. Contact a health care provider if:  You are still having menstrual periods after age 28.  You have pain during sex.  You have not had a period for 12 months and you develop vaginal bleeding. Get help right away if:  You have: ? Severe depression. ? Excessive vaginal bleeding. ? Pain when you urinate. ? A fast or irregular heart beat (palpitations). ? Severe headaches. ? Abdomen (abdominal) pain or severe indigestion.  You fell and you think you have a broken bone.  You develop leg or chest pain.  You develop vision problems.  You feel a lump in your  breast. Summary  Menopause is the normal time of life when menstrual periods stop completely. It is usually confirmed by 12 months without a menstrual period.  The transition to menopause (perimenopause) most often happens between the ages of 57 and 14.  Symptoms can be managed through medicines, lifestyle changes, and complementary therapies such as acupuncture.  Eat a balanced diet that is rich in nutrients to promote bone health and heart health and to manage symptoms during menopause. This information is not intended to replace advice given to you by your health care provider. Make sure you discuss any questions you have with your health care provider. Document Revised: 04/14/2017 Document Reviewed: 06/04/2016 Elsevier Patient Education  2020 [employer] 000IU daily and  600 mg calcium daily

## 2020-04-24 NOTE — Progress Notes (Signed)
Patient ID: Jennifer Gonzales, female   DOB: [birth year], 39 y.o.   MRN: [id number] History of Present Illness: Jennifer Gonzales is a 39 year old white female, divorced, G2P2 in for a well woman gyn exam and pap. She is having hot flashes, weight gain and vaginal dryness and hair changes. PCP is Dr Margo Aye   Current Medications, Allergies, Past Medical History, Past Surgical History, Family History and Social History were reviewed in [employer] [facility] electronic medical record.     Review of Systems:  Patient denies any headaches, hearing loss, fatigue, blurred vision, shortness of breath, chest pain, abdominal pain, problems with bowel movements, urination, or intercourse(not currently active). No joint pain or mood swings. See HPI for positives.  Physical Exam:BP (!) 146/92 (BP Location: Left Arm, Patient Position: Sitting, Cuff Size: Normal)   Pulse 87   Ht 4' 11.5" (1.511 m)   Wt 235 lb (106.6 kg)   BMI 46.67 kg/m  General:  Well developed, well nourished, no acute distress Skin:  Warm and dry Neck:  Midline trachea, normal thyroid, good ROM, no lymphadenopathy Lungs; Clear to auscultation bilaterally Breast:  No dominant palpable mass, retraction, or nipple discharge Cardiovascular: Regular rate and rhythm Abdomen:  Soft, non tender, no hepatosplenomegaly Pelvic:  External genitalia is normal in appearance, no lesions.  The vagina is normal in appearance. Urethra has no lesions or masses. The cervix is bulbous.Pap with GC/CHL and high risk HPV genotyping performed.  Uterus is felt to be normal size, shape, and contour.  No adnexal masses or tenderness noted.Bladder is non tender, no masses felt. Extremities/musculoskeletal:  No swelling or varicosities noted, no clubbing or cyanosis Psych:  No mood changes, alert and cooperative,seems happy AA is 2 Fall risk is low PHQ 9 score is 5, no SI, declines meds  [facility] - 04/24/20 0846      Pregnancy Intention Screening   Does the patient want to  become pregnant in the next year? No    Does the patient's partner want to become pregnant in the next year? No    Would the patient like to discuss contraceptive options today? No      Contraception Wrap Up   Current Method Hormonal Injection    End Method Hormonal Injection    Contraception Counseling Provided No         Examination chaperoned by Marchelle Folks LPN  Impression and Plan: 1. Encounter for gynecological examination with Papanicolaou smear of cervix Pap sent Physical in 1 year  Pap in 3 if normal Mammogram at 40 Labs at work She declines COVID vaccine and flu vaccine   2. Encounter for surveillance of injectable contraceptive Will refill depo Meds ordered this encounter  Medications  . medroxyPROGESTERone Acetate 150 MG/ML SUSY    Sig: USE AS DIRECTED AT PHYSICIANS OFFICE EVERY 3 MONTHS    Dispense:  1 mL    Refill:  3    Order Specific Question:   Supervising Provider    Answer:   Despina Hidden, LUTHER H [[id number]]  . Cholecalciferol 125 MCG (5000 UT) capsule    Sig: Take 1 capsule (5,000 Units total) by mouth daily.    Order Specific Question:   Supervising Provider    Answer:   Duane Lope H [[id number]]  . calcium carbonate (CALCIUM 600) 600 MG TABS tablet    Sig: Take 1 daily    Dispense:  30 tablet    Order Specific Question:   Supervising Provider    Answer:   Lazaro Arms [  [id number]]  Try biotin for hair   3. Hot flashes Review handout on Menopasue, her mom started about this time  4. Weight gain   5. Smoker She declines quitting at this time

## 2020-04-29 ENCOUNTER — Telehealth: Payer: Self-pay | Admitting: Adult Health

## 2020-04-29 DIAGNOSIS — R87810 Cervical high risk human papillomavirus (HPV) DNA test positive: Secondary | ICD-10-CM

## 2020-04-29 LAB — CYTOLOGY - PAP
Chlamydia: NEGATIVE
Comment: NEGATIVE
Comment: NEGATIVE
Comment: NEGATIVE
Comment: NORMAL
HPV 16: NEGATIVE
HPV 18 / 45: POSITIVE — AB
High risk HPV: POSITIVE — AB
Neisseria Gonorrhea: NEGATIVE

## 2020-04-29 NOTE — Telephone Encounter (Signed)
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 To get appt with Dr Despina Hidden

## 2020-05-10 ENCOUNTER — Other Ambulatory Visit: Payer: Self-pay

## 2020-05-10 ENCOUNTER — Emergency Department (HOSPITAL_COMMUNITY): Payer: Commercial Managed Care - PPO

## 2020-05-10 ENCOUNTER — Encounter (HOSPITAL_COMMUNITY): Payer: Self-pay

## 2020-05-10 ENCOUNTER — Emergency Department (HOSPITAL_COMMUNITY)
Admission: EM | Admit: 2020-05-10 | Discharge: 2020-05-10 | Disposition: A | Discharge status: Home / Self Care | Payer: Commercial Managed Care - PPO | Attending: Emergency Medicine | Admitting: Emergency Medicine

## 2020-05-10 DIAGNOSIS — R110 Nausea: Secondary | ICD-10-CM | POA: Diagnosis not present

## 2020-05-10 DIAGNOSIS — F17210 Nicotine dependence, cigarettes, uncomplicated: Secondary | ICD-10-CM | POA: Insufficient documentation

## 2020-05-10 DIAGNOSIS — R1084 Generalized abdominal pain: Secondary | ICD-10-CM | POA: Insufficient documentation

## 2020-05-10 DIAGNOSIS — R059 Cough, unspecified: Secondary | ICD-10-CM | POA: Diagnosis present

## 2020-05-10 DIAGNOSIS — R630 Anorexia: Secondary | ICD-10-CM | POA: Diagnosis not present

## 2020-05-10 DIAGNOSIS — U071 COVID-19: Secondary | ICD-10-CM | POA: Insufficient documentation

## 2020-05-10 LAB — CBC
HCT: 45.7 % (ref 36.0–46.0)
Hemoglobin: 15.4 g/dL — ABNORMAL HIGH (ref 12.0–15.0)
MCH: 31.5 pg (ref 26.0–34.0)
MCHC: 33.7 g/dL (ref 30.0–36.0)
MCV: 93.5 fL (ref 80.0–100.0)
Platelets: 186 10*3/uL (ref 150–400)
RBC: 4.89 MIL/uL (ref 3.87–5.11)
RDW: 12 % (ref 11.5–15.5)
WBC: 6.4 10*3/uL (ref 4.0–10.5)
nRBC: 0 % (ref 0.0–0.2)

## 2020-05-10 LAB — COMPREHENSIVE METABOLIC PANEL
ALT: 45 U/L — ABNORMAL HIGH (ref 0–44)
AST: 23 U/L (ref 15–41)
Albumin: 3.9 g/dL (ref 3.5–5.0)
Alkaline Phosphatase: 62 U/L (ref 38–126)
Anion gap: 7 (ref 5–15)
BUN: 16 mg/dL (ref 6–20)
CO2: 20 mmol/L — ABNORMAL LOW (ref 22–32)
Calcium: 8.7 mg/dL — ABNORMAL LOW (ref 8.9–10.3)
Chloride: 108 mmol/L (ref 98–111)
Creatinine, Ser: 0.63 mg/dL (ref 0.44–1.00)
GFR, Estimated: >60 mL/min (ref >60)
Glucose, Bld: 90 mg/dL (ref 70–99)
Potassium: 3.9 mmol/L (ref 3.5–5.1)
Sodium: 135 mmol/L (ref 135–145)
Total Bilirubin: 0.5 mg/dL (ref 0.3–1.2)
Total Protein: 7.1 g/dL (ref 6.5–8.1)

## 2020-05-10 LAB — URINALYSIS, ROUTINE W REFLEX MICROSCOPIC
Bilirubin Urine: NEGATIVE
Glucose, UA: NEGATIVE mg/dL
Hgb urine dipstick: NEGATIVE
Ketones, ur: NEGATIVE mg/dL
Leukocytes,Ua: NEGATIVE
Nitrite: NEGATIVE
Protein, ur: NEGATIVE mg/dL
Specific Gravity, Urine: 1.021 (ref 1.005–1.030)
pH: 5 (ref 5.0–8.0)

## 2020-05-10 LAB — PREGNANCY, URINE: Preg Test, Ur: NEGATIVE

## 2020-05-10 LAB — LIPASE, BLOOD: Lipase: 29 U/L (ref 11–51)

## 2020-05-10 MED ORDER — DICYCLOMINE HCL 20 MG PO TABS
20.0000 mg | ORAL_TABLET | Freq: Two times a day (BID) | ORAL | 0 refills | Status: DC
Start: 2020-05-10 — End: 2021-04-23

## 2020-05-10 MED ORDER — ONDANSETRON HCL 4 MG/2ML IJ SOLN
4.0000 mg | Freq: Once | INTRAMUSCULAR | Status: AC
  Administered 2020-05-10: 4 mg via INTRAVENOUS
  Filled 2020-05-10: qty 2

## 2020-05-10 MED ORDER — ONDANSETRON 4 MG PO TBDP
4.0000 mg | ORAL_TABLET | Freq: Three times a day (TID) | ORAL | 0 refills | Status: DC | PRN
Start: 2020-05-10 — End: 2021-10-08

## 2020-05-10 MED ORDER — SODIUM CHLORIDE 0.9 % IV BOLUS
1000.0000 mL | Freq: Once | INTRAVENOUS | Status: AC
  Administered 2020-05-10: 1000 mL via INTRAVENOUS

## 2020-05-10 MED ORDER — IOHEXOL 300 MG/ML  SOLN
100.0000 mL | Freq: Once | INTRAMUSCULAR | Status: AC | PRN
  Administered 2020-05-10: 100 mL via INTRAVENOUS

## 2020-05-10 MED ORDER — MORPHINE SULFATE (PF) 4 MG/ML IV SOLN
4.0000 mg | Freq: Once | INTRAVENOUS | Status: AC
  Administered 2020-05-10: 4 mg via INTRAVENOUS
  Filled 2020-05-10: qty 1

## 2020-05-10 NOTE — ED Triage Notes (Signed)
Pt to er, pt states that she is here for some abd pain. States that she has had abd pain since last Friday 17th.  States that earlier that week her kids tested positive for covid, states that her job had her get tested for covid, states that Tuesday she tested positive for covid and was feeling better, then she started having the pain again that night and has had it ever since.

## 2020-05-10 NOTE — ED Provider Notes (Signed)
[facility] EMERGENCY DEPARTMENT Provider Note   CSN: [id number] Arrival date & time: 05/10/20  1134     History Chief Complaint  Patient presents with  . Abdominal Pain    Jennifer Gonzales is a 39 y.o. female with past medical history significant for recent diagnosis of Covid who presents for evaluation of abdominal pain.  Initially started as generalized abdominal pain.  Now located to her right mid abdomen intermittently and back to generalized abd pain.  She has had prior cholecystectomy.  Still has her appendix.  Had diarrhea a few days ago however this resolved. Admits to decreased appetite and intermittent nausea. Has had generalized fatigue and malaise. She denies fever, chills, vomiting, chest pain, shortness of breath, pelvic pain, vaginal discharge, dysuria.  Denies additional aggravating or alleviating factors.  Not take anything for symptoms.  Rates her pain a 6/10.  Diagnosed with Covid on Tuesday, 5 days PTA.  Still has cough however denies any chest pain, shortness of breath  History obtained from patient and past medical records.  No interpreter used  HPI     Past Medical History:  Diagnosis Date  . Back pain   . BV (bacterial vaginosis)   . 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 Fibromyalgia   . Hx of migraines   . Mental disorder   . Papanicolaou smear of cervix with positive high risk human papilloma virus (HPV) test 04/02/2019   03/2019, +HPV 18 needs colpo___  . Vaginal Pap smear, abnormal     Patient Active Problem List   Diagnosis Date Noted  . Encounter for gynecological examination with Papanicolaou smear of cervix 04/24/2020  . Encounter for surveillance of injectable contraceptive 04/24/2020  . Weight gain 04/24/2020  . Hot flashes 04/24/2020  . Smoker 04/24/2020  . Papanicolaou smear of cervix with positive high risk human papilloma virus (HPV) test 04/02/2019  . Cervical high risk human papillomavirus (HPV)  DNA test positive 12/05/2016  . Hyperlipemia 04/05/2015  . Esophageal reflux 04/05/2015  . Obesity, unspecified 04/05/2015  . Hx of migraines 07/28/2012  . Depression 07/28/2012  . Back pain 07/28/2012  . Fibromyalgia 07/28/2012  . BV (bacterial vaginosis) 07/28/2012    Past Surgical History:  Procedure Laterality Date  . BACK SURGERY    . CESAREAN SECTION    . CHOLECYSTECTOMY    . TONSILLECTOMY       OB History    Gravida  2   Para  2   Term  2   Preterm      AB      Living  2     SAB      IAB      Ectopic      Multiple      Live Births  2           Family History  Problem Relation Age of Onset  . Hypertension Other   . Diabetes Other   . Stroke Other   . Heart disease Other   . Cancer Paternal Grandmother        breast  . Diabetes Father   . Muscular dystrophy Mother   . Muscular dystrophy Brother   . Muscular dystrophy Sister   . Muscular dystrophy Sister   . Asthma Son   . Other Son        allergies    Social History   Tobacco Use  . Smoking status: Current Every Day Smoker  Packs/day: 0.25    Years: 23.00    Pack years: 5.75    Types: Cigarettes  . Smokeless tobacco: Never Used  Vaping Use  . Vaping Use: Former  Substance Use Topics  . Alcohol use: Yes  . Drug use: No    Home Medications Prior to Admission medications   Medication Sig Start Date End Date Taking? Authorizing Provider  albuterol (VENTOLIN HFA) 108 (90 Base) MCG/ACT inhaler Inhale 2 puffs into the lungs every 6 (six) hours as needed for wheezing or shortness of breath. 09/24/19   Mare FerrariBelaya, Maria A, PA-C  busPIRone (BUSPAR) 15 MG tablet Take 10 mg by mouth 3 (three) times daily.    [provider]  calcium carbonate (CALCIUM 600) 600 MG TABS tablet Take 1 daily 04/24/20   Cyril MourningGriffin, Jennifer A, NP  cetirizine (ZYRTEC) 10 MG tablet Take 10 mg by mouth daily as needed for allergies.    [provider]  Cholecalciferol 125 MCG (5000 UT) capsule Take  1 capsule (5,000 Units total) by mouth daily. 04/24/20   Adline PotterGriffin, Jennifer A, NP  dicyclomine (BENTYL) 20 MG tablet Take 1 tablet (20 mg total) by mouth 2 (two) times daily. 05/10/20   Cheng Dec A, PA-C  ipratropium (ATROVENT) 0.03 % nasal spray Place 2 sprays into both nostrils 3 (three) times daily.  09/24/19   [provider]  lansoprazole (PREVACID) 30 MG capsule Take 30 mg by mouth daily at 12 noon.    [provider]  medroxyPROGESTERone Acetate 150 MG/ML SUSY USE AS DIRECTED AT PHYSICIANS OFFICE EVERY 3 MONTHS 04/24/20   Cyril MourningGriffin, Jennifer A, NP  montelukast (SINGULAIR) 10 MG tablet Take 10 mg by mouth at bedtime.  09/23/19   [provider]  Olopatadine HCl 0.2 % SOLN Apply 1 drop to eye daily as needed (FOR ALLERGY EYE RELIEF).  09/24/19   [provider]  ondansetron (ZOFRAN ODT) 4 MG disintegrating tablet Take 1 tablet (4 mg total) by mouth every 8 (eight) hours as needed for nausea or vomiting. 05/10/20   Cheryel Kyte A, PA-C  topiramate (TOPAMAX) 25 MG tablet Take 25 mg by mouth at bedtime.  07/17/17   [provider]    Allergies    Ketorolac tromethamine and Sulfa antibiotics  Review of Systems   Review of Systems  Constitutional: Negative.   HENT: Negative.   Respiratory: Positive for cough. Negative for apnea, choking, chest tightness, shortness of breath, wheezing and stridor.   Cardiovascular: Negative.   Gastrointestinal: Positive for abdominal pain and diarrhea (Resolved). Negative for abdominal distention, anal bleeding, blood in stool, constipation, nausea, rectal pain and vomiting.  Genitourinary: Negative.   Musculoskeletal: Negative.   Skin: Negative.   Neurological: Negative.   All other systems reviewed and are negative.   Physical Exam Updated Vital Signs BP 129/79 (BP Location: Left Arm)   Pulse 75   Temp 98.4 F (36.9 C) (Oral)   Resp 16   Ht 4' 11.5" (1.511 m)   Wt 102.5 kg   SpO2 98%   BMI 44.88  kg/m   Physical Exam Vitals and nursing note reviewed.  Constitutional:      General: She is not in acute distress.    Appearance: She is well-developed and well-nourished. She is not ill-appearing, toxic-appearing or diaphoretic.  HENT:     Head: Normocephalic and atraumatic.     Mouth/Throat:     Mouth: Mucous membranes are moist.  Eyes:     Pupils: Pupils are equal, round,  and reactive to light.  Cardiovascular:     Rate and Rhythm: Normal rate.     Pulses: Intact distal pulses.     Heart sounds: Normal heart sounds.  Pulmonary:     Effort: Pulmonary effort is normal. No respiratory distress.     Breath sounds: Normal breath sounds.     Comments: Speaks in full sentences without difficulty.  Clear to auscultation bilaterally wheeze, rhonchi or rales Abdominal:     General: Bowel sounds are normal. There is no distension.     Palpations: Abdomen is soft.     Tenderness: There is abdominal tenderness. There is no right CVA tenderness, left CVA tenderness or guarding. Negative signs include Murphy's sign and McBurney's sign.     Hernia: No hernia is present.     Comments: Diffuse right-sided abdominal tenderness.  Does not extend into pelvic region.  Negative Murphy sign, guarding point.  Genitourinary:    Comments: Declined Musculoskeletal:        General: Normal range of motion.     Cervical back: Normal range of motion.  Skin:    General: Skin is warm and dry.     Capillary Refill: Capillary refill takes less than 2 seconds.  Neurological:     General: No focal deficit present.     Mental Status: She is alert.  Psychiatric:        Mood and Affect: Mood and affect normal.    ED Results / Procedures / Treatments   Labs (all labs ordered are listed, but only abnormal results are displayed) Labs Reviewed  COMPREHENSIVE METABOLIC PANEL - Abnormal; Notable for the following components:      Result Value   CO2 20 (*)    Calcium 8.7 (*)    ALT 45 (*)    All other  components within normal limits  CBC - Abnormal; Notable for the following components:   Hemoglobin 15.4 (*)    All other components within normal limits  URINALYSIS, ROUTINE W REFLEX MICROSCOPIC - Abnormal; Notable for the following components:   APPearance HAZY (*)    All other components within normal limits  LIPASE, BLOOD  PREGNANCY, URINE    EKG None  Radiology CT Abdomen Pelvis W Contrast  Result Date: 05/10/2020 CLINICAL DATA:  Right sided abdominal pain for 2 weeks, history of cholecystectomy, C-section, COVID infection EXAM: CT ABDOMEN AND PELVIS WITH CONTRAST TECHNIQUE: Multidetector CT imaging of the abdomen and pelvis was performed using the standard protocol following bolus administration of intravenous contrast. CONTRAST:  OMNIPAQUE IOHEXOL 300 MG/ML  SOLN COMPARISON:  07/31/2015 FINDINGS: Lower chest: Minimal peripheral ground-glass airspace opacity of the right middle lobe and right lower lobe (series 4, image 1). Hepatobiliary: No focal liver abnormality is seen. Status post cholecystectomy. No biliary dilatation. Pancreas: Unremarkable. No pancreatic ductal dilatation or surrounding inflammatory changes. Spleen: Normal in size without significant abnormality. Adrenals/Urinary Tract: Adrenal glands are unremarkable. Kidneys are normal, without renal calculi, solid lesion, or hydronephrosis. Bladder is unremarkable. Stomach/Bowel: Stomach is within normal limits. Appendix appears normal. No evidence of bowel wall thickening, distention, or inflammatory changes. Vascular/Lymphatic: No significant vascular findings are present. No enlarged abdominal or pelvic lymph nodes. Reproductive: No mass or other significant abnormality. Other: No abdominal wall hernia or abnormality. No abdominopelvic ascites. Musculoskeletal: No acute or significant osseous findings. IMPRESSION: 1. No acute CT findings of the abdomen or pelvis to explain right-sided abdominal pain. 2. Normal appendix. 3.  Status post cholecystectomy. 4. Minimal peripheral ground-glass airspace  opacity of the partially imaged right middle and right lower lobes, generally in keeping with reported diagnosis of COVID. Electronically Signed   By: Lauralyn Primes M.D.   On: 05/10/2020 15:46    Procedures Procedures (including critical care time)  Medications Ordered in ED Medications  ondansetron (ZOFRAN) injection 4 mg (4 mg Intravenous Given 05/10/20 1600)  sodium chloride 0.9 % bolus 1,000 mL (1,000 mLs Intravenous New Bag/Given 05/10/20 1559)  morphine 4 MG/ML injection 4 mg (4 mg Intravenous Given 05/10/20 1601)  iohexol (OMNIPAQUE) 300 MG/ML solution 100 mL (100 mLs Intravenous Contrast Given 05/10/20 1533)    ED Course  I have reviewed the triage vital signs and the nursing notes.  Pertinent labs & imaging results that were available during my care of the patient were reviewed by me and considered in my medical decision making (see chart for details).  39 year old, recent Covid diagnosis presents for evaluation of abdominal pain.  She is afebrile, nonseptic, not ill-appearing.  Her heart and lungs are clear.  Her abdomen is diffusely tender to her right abdomen however negative Murphy sign, no McBurney point.  Pain does not extend into her pelvis, back.  No urinary complaints.  She denies any pelvic pain, vaginal discharge.  Denies chance of pregnancy.  Symptoms x1 week.  We will plan on labs, imaging and reassess  Labs and imaging personally reviewed and interpreted:  CBC without leukocytosis Metabolic panel with CO2 of 20, ALT at 45, no additional electrolyte, renal or liver abnormality Pregnancy test negative UA negative for infection, lipase 29 CTAP without acute abnormality.  Patient reassessed. Pain improved however states the pain is "all over and cramping".  Discussed CT results without any significant abnormality.  Patient became very upset.  She states she does not know why she has abdominal  pain since she got Covid.  Discussed with patient that some patients do have GI symptoms with Covid.  She states that she does not think this is true that her kids did not have this and they had Covid.  Continues to to deny any pelvic pain, vaginal discharge.  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  I discussed close follow-up with PCP or GI for symptoms not resolved.  Will DC home with antiemetics, Bentyl.  At this time I do not feel there is any emergent pathology.  Patient is nontoxic, nonseptic appearing, in no apparent distress.  Patient's pain and other symptoms adequately managed in emergency department.  Fluid bolus given.  Labs, imaging and vitals reviewed.  Patient does not meet the SIRS or Sepsis criteria.  On repeat exam patient does not have a surgical abdomin and there are no peritoneal signs.  No indication of appendicitis, bowel obstruction, bowel perforation, cholecystitis, diverticulitis, TOA, torsion, PID.  The patient has been appropriately medically screened and/or stabilized in the ED. I have low suspicion for any other emergent medical condition which would require further screening, evaluation or treatment in the ED or require inpatient management.  Patient is hemodynamically stable and in no acute distress.  Patient able to ambulate in department prior to ED.  Evaluation does not show acute pathology that would require ongoing or additional emergent interventions while in the emergency department or further inpatient treatment.  I have discussed the diagnosis with the patient and answered all questions.  Pain is been managed while in the emergency department and patient has no further complaints prior to discharge.  Patient is comfortable with plan discussed in room and is stable for discharge at this time.  I have discussed  strict return precautions for returning to the emergency department.  Patient was encouraged to follow-up with PCP/specialist refer to at discharge.    MDM Rules/Calculators/A&P                          Jennifer Gonzales was evaluated in Emergency Department on 05/10/2020 for the symptoms described in the history of present illness. She was evaluated in the context of the global COVID-19 pandemic, which necessitated consideration that the patient might be at risk for infection with the SARS-CoV-2 virus that causes COVID-19. Institutional protocols and algorithms that pertain to the evaluation of patients at risk for COVID-19 are in a state of rapid change based on information released by regulatory bodies including the CDC and federal and state organizations. These policies and algorithms were followed during the patient's care in the ED. Final Clinical Impression(s) / ED Diagnoses Final diagnoses:  Generalized abdominal pain  COVID    Rx / DC Orders ED Discharge Orders         Ordered    ondansetron (ZOFRAN ODT) 4 MG disintegrating tablet  Every 8 hours PRN        05/10/20 1639    dicyclomine (BENTYL) 20 MG tablet  2 times daily        05/10/20 1639           Precious Gilchrest A, PA-C 05/10/20 1647    Vanetta Mulders, MD 05/23/20 2354

## 2020-05-10 NOTE — ED Notes (Addendum)
Pt chose not to take covid vaccine  Pt reports pos covid test Tuesday   Also R sided abd pain x 2 weeks  Here for complaint of abd pain

## 2020-05-10 NOTE — Discharge Instructions (Signed)
Bentyl for abdominal pain Zofran for nausea  Return for new worsening symptoms.

## 2020-05-18 ENCOUNTER — Encounter: Payer: Commercial Managed Care - PPO | Admitting: Obstetrics & Gynecology

## 2020-05-22 ENCOUNTER — Ambulatory Visit: Payer: Commercial Managed Care - PPO

## 2020-05-25 ENCOUNTER — Other Ambulatory Visit: Payer: Self-pay | Admitting: Adult Health

## 2020-05-25 DIAGNOSIS — Z3042 Encounter for surveillance of injectable contraceptive: Secondary | ICD-10-CM

## 2020-05-28 ENCOUNTER — Ambulatory Visit (INDEPENDENT_AMBULATORY_CARE_PROVIDER_SITE_OTHER): Payer: Commercial Managed Care - PPO | Admitting: *Deleted

## 2020-05-28 ENCOUNTER — Encounter: Payer: Self-pay | Admitting: Obstetrics & Gynecology

## 2020-05-28 ENCOUNTER — Ambulatory Visit (INDEPENDENT_AMBULATORY_CARE_PROVIDER_SITE_OTHER): Payer: Commercial Managed Care - PPO | Admitting: Obstetrics & Gynecology

## 2020-05-28 ENCOUNTER — Other Ambulatory Visit: Payer: Self-pay

## 2020-05-28 VITALS — BP 144/96 | HR 90 | Ht 59.5 in | Wt 240.0 lb

## 2020-05-28 DIAGNOSIS — Z3042 Encounter for surveillance of injectable contraceptive: Secondary | ICD-10-CM

## 2020-05-28 DIAGNOSIS — R87810 Cervical high risk human papillomavirus (HPV) DNA test positive: Secondary | ICD-10-CM

## 2020-05-28 DIAGNOSIS — N870 Mild cervical dysplasia: Secondary | ICD-10-CM | POA: Diagnosis not present

## 2020-05-28 MED ORDER — MEDROXYPROGESTERONE ACETATE 150 MG/ML IM SUSP
150.0000 mg | Freq: Once | INTRAMUSCULAR | Status: AC
  Administered 2020-05-28: 150 mg via INTRAMUSCULAR

## 2020-05-28 NOTE — Progress Notes (Signed)
   NURSE VISIT- INJECTION  SUBJECTIVE:  Jennifer Gonzales is a 40 y.o. [id number] female here for a Depo Provera for contraception/period management. She is a GYN patient.   OBJECTIVE:  There were no vitals taken for this visit.  Appears well, in no apparent distress  Injection administered in: Left deltoid  Meds ordered this encounter  Medications  . medroxyPROGESTERone (DEPO-PROVERA) injection 150 mg    ASSESSMENT: GYN patient Depo Provera for contraception/period management PLAN: Follow-up: in 11-13 weeks for next Depo   Annamarie Dawley  05/28/2020 3:31 PM

## 2020-05-28 NOTE — Progress Notes (Signed)
  Colposcopy Procedure Note:  Colposcopy Procedure Note  Indications:  2021  LSIL/+HPV 18  2020 normal cytology/+HPV 18  2019 ASCCP recommendation:  Smoker:  Yes.  .25 packs/day New sexual partner:  No.    History of abnormal Pap: yes  Procedure Details  The risks and benefits of the procedure and Written informed consent obtained.  Speculum placed in vagina and excellent visualization of cervix achieved, cervix swabbed x 3 with acetic acid solution.  Findings: Cervix: no visible lesions, no mosaicism, no punctation and no abnormal vasculature; SCJ visualized 360 degrees without lesions and no biopsies taken. Vaginal inspection: normal without visible lesions. Vulvar colposcopy: vulvar colposcopy not performed.  Specimens: none  Complications: none.  Plan(Based on 2019 ASCCP recommendations) Repeat HPV based cytology in 1 year

## 2020-08-14 ENCOUNTER — Ambulatory Visit (INDEPENDENT_AMBULATORY_CARE_PROVIDER_SITE_OTHER): Payer: Commercial Managed Care - PPO | Admitting: *Deleted

## 2020-08-14 ENCOUNTER — Other Ambulatory Visit: Payer: Self-pay

## 2020-08-14 DIAGNOSIS — Z3042 Encounter for surveillance of injectable contraceptive: Secondary | ICD-10-CM

## 2020-08-14 MED ORDER — MEDROXYPROGESTERONE ACETATE 150 MG/ML IM SUSP
150.0000 mg | Freq: Once | INTRAMUSCULAR | Status: AC
  Administered 2020-08-14: 150 mg via INTRAMUSCULAR

## 2020-08-14 NOTE — Progress Notes (Signed)
   NURSE VISIT- INJECTION  SUBJECTIVE:  Jennifer Gonzales is a 40 y.o. [id number] female here for a Depo Provera for contraception/period management. She is a GYN patient.   OBJECTIVE:  There were no vitals taken for this visit.  Appears well, in no apparent distress  Injection administered in: Right deltoid  Meds ordered this encounter  Medications  . medroxyPROGESTERone (DEPO-PROVERA) injection 150 mg    ASSESSMENT: GYN patient Depo Provera for contraception/period management PLAN: Follow-up: in 11-13 weeks for next Depo   Jobe Marker  08/14/2020 10:58 AM

## 2020-11-06 ENCOUNTER — Other Ambulatory Visit: Payer: Self-pay

## 2020-11-06 ENCOUNTER — Ambulatory Visit (INDEPENDENT_AMBULATORY_CARE_PROVIDER_SITE_OTHER): Payer: Commercial Managed Care - PPO

## 2020-11-06 DIAGNOSIS — Z3042 Encounter for surveillance of injectable contraceptive: Secondary | ICD-10-CM

## 2020-11-06 MED ORDER — MEDROXYPROGESTERONE ACETATE 150 MG/ML IM SUSY: PREFILLED_SYRINGE | Freq: Once | INTRAMUSCULAR | Status: AC

## 2020-11-06 NOTE — Progress Notes (Signed)
   NURSE VISIT- INJECTION  SUBJECTIVE:  Jennifer Gonzales is a 40 y.o. [id number] female here for a Depo Provera for contraception/period management. She is a GYN patient.   OBJECTIVE:  There were no vitals taken for this visit.  Appears well, in no apparent distress  Injection administered in: Left deltoid  Meds ordered this encounter  Medications   medroxyPROGESTERone Acetate SUSY    ASSESSMENT: GYN patient Depo Provera for contraception/period management PLAN: Follow-up: in 11-13 weeks for next Depo   Broc Caspers A Osha Rane  11/06/2020 10:07 AM

## 2021-01-29 ENCOUNTER — Other Ambulatory Visit: Payer: Self-pay

## 2021-01-29 ENCOUNTER — Ambulatory Visit (INDEPENDENT_AMBULATORY_CARE_PROVIDER_SITE_OTHER): Payer: Commercial Managed Care - PPO

## 2021-01-29 DIAGNOSIS — Z3042 Encounter for surveillance of injectable contraceptive: Secondary | ICD-10-CM

## 2021-01-29 MED ORDER — MEDROXYPROGESTERONE ACETATE 150 MG/ML IM SUSY: PREFILLED_SYRINGE | Freq: Once | INTRAMUSCULAR | Status: AC

## 2021-01-29 NOTE — Progress Notes (Signed)
   NURSE VISIT- INJECTION  SUBJECTIVE:  Jennifer Gonzales is a 40 y.o. [id number] female here for a Depo Provera for contraception/period management. She is a GYN patient.   OBJECTIVE:  There were no vitals taken for this visit.  Appears well, in no apparent distress  Injection administered in: Left deltoid  Meds ordered this encounter  Medications   medroxyPROGESTERone Acetate SUSY    ASSESSMENT: GYN patient Depo Provera for contraception/period management PLAN: Follow-up: in 11-13 weeks for next Depo   Meria Crilly A Clarabel Marion  01/29/2021 10:04 AM

## 2021-04-23 ENCOUNTER — Ambulatory Visit (INDEPENDENT_AMBULATORY_CARE_PROVIDER_SITE_OTHER): Payer: Commercial Managed Care - PPO | Admitting: *Deleted

## 2021-04-23 ENCOUNTER — Other Ambulatory Visit: Payer: Self-pay

## 2021-04-23 DIAGNOSIS — Z3042 Encounter for surveillance of injectable contraceptive: Secondary | ICD-10-CM

## 2021-04-23 MED ORDER — MEDROXYPROGESTERONE ACETATE 150 MG/ML IM SUSP
150.0000 mg | Freq: Once | INTRAMUSCULAR | Status: AC
  Administered 2021-04-23: 150 mg via INTRAMUSCULAR

## 2021-04-23 NOTE — Progress Notes (Signed)
   NURSE VISIT- INJECTION  SUBJECTIVE:  Jennifer Gonzales is a 40 y.o. [id number] female here for a Depo Provera for contraception/period management. She is a GYN patient.   OBJECTIVE:  There were no vitals taken for this visit.  Appears well, in no apparent distress  Injection administered in: Right deltoid  Meds ordered this encounter  Medications   medroxyPROGESTERone (DEPO-PROVERA) injection 150 mg    ASSESSMENT: GYN patient Depo Provera for contraception/period management PLAN: Follow-up: in 11-13 weeks for next Depo; schedule pap/physical after 12/10.  Malachy Mood  04/23/2021 10:18 AM

## 2021-06-25 ENCOUNTER — Emergency Department (HOSPITAL_COMMUNITY): Payer: Commercial Managed Care - PPO

## 2021-06-25 ENCOUNTER — Emergency Department (HOSPITAL_COMMUNITY)
Admission: EM | Admit: 2021-06-25 | Discharge: 2021-06-26 | Disposition: A | Discharge status: Home / Self Care | Payer: Commercial Managed Care - PPO | Attending: Emergency Medicine | Admitting: Emergency Medicine

## 2021-06-25 ENCOUNTER — Other Ambulatory Visit: Payer: Self-pay

## 2021-06-25 DIAGNOSIS — R0602 Shortness of breath: Secondary | ICD-10-CM

## 2021-06-25 DIAGNOSIS — J069 Acute upper respiratory infection, unspecified: Secondary | ICD-10-CM

## 2021-06-25 DIAGNOSIS — B9789 Other viral agents as the cause of diseases classified elsewhere: Secondary | ICD-10-CM | POA: Diagnosis not present

## 2021-06-25 DIAGNOSIS — Z20822 Contact with and (suspected) exposure to covid-19: Secondary | ICD-10-CM | POA: Diagnosis not present

## 2021-06-25 DIAGNOSIS — R059 Cough, unspecified: Secondary | ICD-10-CM | POA: Diagnosis present

## 2021-06-25 MED ORDER — ALBUTEROL SULFATE HFA 108 (90 BASE) MCG/ACT IN AERS
2.0000 | INHALATION_SPRAY | RESPIRATORY_TRACT | Status: DC | PRN
  Administered 2021-06-26: 2 via RESPIRATORY_TRACT
  Filled 2021-06-25: qty 6.7

## 2021-06-25 NOTE — ED Provider Notes (Signed)
[facility] EMERGENCY DEPARTMENT Provider Note   CSN: [id number] Arrival date & time: 06/25/21  2233     History  Chief Complaint  Patient presents with   Cough   Shortness of Breath    Jennifer Gonzales is a 41 y.o. female.  Patient presents with cold symptoms for 2 days.  Patient reports that initially she had a lot of sinus congestion followed by sinus drainage.  Now she has a nonproductive cough and feels short of breath.      Home Medications Prior to Admission medications   Medication Sig Start Date End Date Taking? Authorizing Provider  benzonatate (TESSALON) 200 MG capsule Take 1 capsule (200 mg total) by mouth 3 (three) times daily as needed for cough. 06/26/21  Yes Mahonri Seiden, Canary Brim, MD  predniSONE (DELTASONE) 20 MG tablet Take 2 tablets (40 mg total) by mouth daily with breakfast. 06/26/21  Yes Johnatan Baskette, Canary Brim, MD  Acetaminophen (TYLENOL PO) Take by mouth.    [provider]  albuterol (VENTOLIN HFA) 108 (90 Base) MCG/ACT inhaler Inhale 2 puffs into the lungs every 6 (six) hours as needed for wheezing or shortness of breath. 09/24/19   Mare Ferrari, PA-C  busPIRone (BUSPAR) 15 MG tablet Take 10 mg by mouth.    [provider]  cetirizine (ZYRTEC) 10 MG tablet Take 10 mg by mouth daily as needed for allergies.    [provider]  lansoprazole (PREVACID) 30 MG capsule Take 30 mg by mouth daily at 12 noon.    [provider]  medroxyPROGESTERone Acetate 150 MG/ML SUSY USE AS DIRECTED AT PHYSICIANS OFFICE EVERY 3 MONTHS 05/25/20   Cyril Mourning A, NP  Olopatadine HCl 0.2 % SOLN Apply 1 drop to eye daily as needed (FOR ALLERGY EYE RELIEF).  09/24/19   [provider]  ondansetron (ZOFRAN ODT) 4 MG disintegrating tablet Take 1 tablet (4 mg total) by mouth every 8 (eight) hours as needed for nausea or vomiting. 05/10/20   Henderly, Britni A, PA-C  topiramate (TOPAMAX) 25 MG tablet Take 25 mg by mouth at bedtime.  07/17/17    [provider]      Allergies    Ketorolac tromethamine and Sulfa antibiotics    Review of Systems   Review of Systems  HENT:  Positive for congestion.   Respiratory:  Positive for cough and shortness of breath.    Physical Exam Updated Vital Signs BP (!) 141/96    Pulse (!) 122    Temp 99.5 F (37.5 C)    Resp 18    Ht 5' (1.524 m)    Wt 109 kg    SpO2 95%    BMI 46.93 kg/m  Physical Exam Vitals and nursing note reviewed.  Constitutional:      General: She is not in acute distress.    Appearance: She is well-developed.  HENT:     Head: Normocephalic and atraumatic.     Mouth/Throat:     Mouth: Mucous membranes are moist.  Eyes:     General: Vision grossly intact. Gaze aligned appropriately.     Extraocular Movements: Extraocular movements intact.     Conjunctiva/sclera: Conjunctivae normal.  Cardiovascular:     Rate and Rhythm: Normal rate and regular rhythm.     Pulses: Normal pulses.     Heart sounds: Normal heart sounds, S1 normal and S2 normal. No murmur heard.   No friction rub. No gallop.  Pulmonary:     Effort: Pulmonary effort  is normal. No respiratory distress.     Breath sounds: Transmitted upper airway sounds present. Decreased breath sounds present.  Abdominal:     General: Bowel sounds are normal.     Palpations: Abdomen is soft.     Tenderness: There is no abdominal tenderness. There is no guarding or rebound.     Hernia: No hernia is present.  Musculoskeletal:        General: No swelling.     Cervical back: Full passive range of motion without pain, normal range of motion and neck supple. No spinous process tenderness or muscular tenderness. Normal range of motion.     Right lower leg: No edema.     Left lower leg: No edema.  Skin:    General: Skin is warm and dry.     Capillary Refill: Capillary refill takes less than 2 seconds.     Findings: No ecchymosis, erythema, rash or wound.  Neurological:     General: No focal deficit present.      Mental Status: She is alert and oriented to person, place, and time.     GCS: GCS eye subscore is 4. GCS verbal subscore is 5. GCS motor subscore is 6.     Cranial Nerves: Cranial nerves 2-12 are intact.     Sensory: Sensation is intact.     Motor: Motor function is intact.     Coordination: Coordination is intact.  Psychiatric:        Attention and Perception: Attention normal.        Mood and Affect: Mood normal.        Speech: Speech normal.        Behavior: Behavior normal.    ED Results / Procedures / Treatments   Labs (all labs ordered are listed, but only abnormal results are displayed) Labs Reviewed  RESP PANEL BY RT-PCR (FLU A&B, COVID) [id number]    EKG None  Radiology DG Chest Port 1 View  Result Date: 06/25/2021 CLINICAL DATA:  Shortness of breath EXAM: PORTABLE CHEST 1 VIEW COMPARISON:  09/24/2019 FINDINGS: Chronic mild interstitial opacity. No focal consolidation, pleural effusion or pneumothorax. Normal cardiomediastinal silhouette. IMPRESSION: No active disease. Electronically Signed   By: Donavan Foil M.D.   On: 06/25/2021 23:50    Procedures Procedures    Medications Ordered in ED Medications  albuterol (VENTOLIN HFA) 108 (90 Base) MCG/ACT inhaler 2 puff (has no administration in time range)  predniSONE (DELTASONE) tablet 60 mg (has no administration in time range)    ED Course/ Medical Decision Making/ A&P                           Medical Decision Making Amount and/or Complexity of Data Reviewed Radiology: ordered.  Risk Prescription drug management.   Patient presents with 2-day history of nonproductive cough, nasal congestion.  Patient is a smoker.  She does not have active wheezing at this time but likely has had some bronchospasm associated with the upper respiratory infection.  Chest x-ray is clear, no evidence of pneumonia.  No hypoxia or tachypnea.  COVID and influenza testing is negative.  Will treat empirically for a viral URI with  bronchospasm.         Final Clinical Impression(s) / ED Diagnoses Final diagnoses:  Viral URI with cough    Rx / DC Orders ED Discharge Orders          Ordered    benzonatate (TESSALON) 200 MG capsule  3 times daily PRN        06/26/21 0033    predniSONE (DELTASONE) 20 MG tablet  Daily with breakfast        06/26/21 0033              Orpah Greek, MD 06/26/21 [phone]

## 2021-06-25 NOTE — ED Triage Notes (Signed)
Cough, sob x 3 days, fever started today. Nasal congestion and body aches. Pt tested - for covid by home test yesterday.

## 2021-06-26 LAB — RESP PANEL BY RT-PCR (FLU A&B, COVID) ARPGX2
Influenza A by PCR: NEGATIVE
Influenza B by PCR: NEGATIVE
SARS Coronavirus 2 by RT PCR: NEGATIVE

## 2021-06-26 MED ORDER — PREDNISONE 20 MG PO TABS: 40.0000 mg | ORAL_TABLET | Freq: Every day | ORAL | 0 refills | Status: DC

## 2021-06-26 MED ORDER — PREDNISONE 50 MG PO TABS
60.0000 mg | ORAL_TABLET | Freq: Once | ORAL | Status: AC
  Administered 2021-06-26: 60 mg via ORAL
  Filled 2021-06-26: qty 1

## 2021-06-26 MED ORDER — BENZONATATE 200 MG PO CAPS: 200.0000 mg | ORAL_CAPSULE | Freq: Three times a day (TID) | ORAL | 0 refills | Status: DC | PRN

## 2021-07-16 ENCOUNTER — Other Ambulatory Visit: Payer: Self-pay

## 2021-07-16 ENCOUNTER — Other Ambulatory Visit (HOSPITAL_COMMUNITY)
Admission: RE | Admit: 2021-07-16 | Discharge: 2021-07-16 | Disposition: A | Discharge status: Home / Self Care | Payer: Commercial Managed Care - PPO | Source: Ambulatory Visit | Attending: Adult Health | Admitting: Adult Health

## 2021-07-16 ENCOUNTER — Encounter: Payer: Self-pay | Admitting: Adult Health

## 2021-07-16 ENCOUNTER — Other Ambulatory Visit: Payer: Self-pay | Admitting: Adult Health

## 2021-07-16 ENCOUNTER — Ambulatory Visit (INDEPENDENT_AMBULATORY_CARE_PROVIDER_SITE_OTHER): Payer: Commercial Managed Care - PPO | Admitting: Adult Health

## 2021-07-16 VITALS — BP 167/100 | HR 92 | Ht 59.5 in | Wt 247.8 lb

## 2021-07-16 DIAGNOSIS — Z01419 Encounter for gynecological examination (general) (routine) without abnormal findings: Secondary | ICD-10-CM | POA: Insufficient documentation

## 2021-07-16 DIAGNOSIS — Z3042 Encounter for surveillance of injectable contraceptive: Secondary | ICD-10-CM

## 2021-07-16 DIAGNOSIS — Z1211 Encounter for screening for malignant neoplasm of colon: Secondary | ICD-10-CM | POA: Insufficient documentation

## 2021-07-16 DIAGNOSIS — I10 Essential (primary) hypertension: Secondary | ICD-10-CM | POA: Insufficient documentation

## 2021-07-16 LAB — HEMOCCULT GUIAC POC 1CARD (OFFICE): Fecal Occult Blood, POC: NEGATIVE

## 2021-07-16 MED ORDER — HYDROCHLOROTHIAZIDE 12.5 MG PO CAPS: 12.5000 mg | ORAL_CAPSULE | Freq: Every day | ORAL | 3 refills | Status: DC

## 2021-07-16 MED ORDER — MEDROXYPROGESTERONE ACETATE 150 MG/ML IM SUSY: PREFILLED_SYRINGE | INTRAMUSCULAR | 3 refills | Status: DC

## 2021-07-16 NOTE — Progress Notes (Signed)
Patient ID: Jennifer Gonzales, female   DOB: 05/28/1980, 41 y.o.   MRN: [id number] ?History of Present Illness: ?Jennifer Gonzales is a 41 year old white female,divorced, G2P2 in for a well woman gyn exam and pap and needs refill on depo. She works at the [employer]. ?No current PCP. ? ? ?Current Medications, Allergies, Past Medical History, Past Surgical History, Family History and Social History were reviewed in [employer] record.   ? ? ?Review of Systems: ? ?Patient denies any headaches, hearing loss, fatigue, blurred vision, shortness of breath, chest pain, abdominal pain, problems with bowel movements, urination, or intercourse. No joint pain or mood swings.  ?Has cramping in ovary area at times for about last 6 months. ? ? ?Physical Exam:BP (!) 167/100 (BP Location: Left Arm, Cuff Size: Large)   Pulse 92   Ht 4' 11.5" (1.511 m)   Wt 247 lb 12 oz (112.4 kg)   BMI 49.20 kg/m?   she says BP up since COVID  ?General:  Well developed, well nourished, no acute distress ?Skin:  Warm and dry ?Neck:  Midline trachea, normal thyroid, good ROM, no lymphadenopathy ?Lungs; Clear to auscultation bilaterally ?Breast:  No dominant palpable mass, retraction, or nipple discharge ?Cardiovascular: Regular rate and rhythm ?Abdomen:  Soft, non tender, no hepatosplenomegaly ?Pelvic:  External genitalia is normal in appearance, no lesions.  The vagina is normal in appearance. Urethra has no lesions or masses. The cervix is bulbous. Pap with HR HPV genotyping performed. Uterus is felt to be normal size, shape, and contour.  No adnexal masses or tenderness noted.Bladder is non tender, no masses felt. ?Rectal: Good sphincter tone, no polyps, or hemorrhoids felt.  Hemoccult negative. ?Extremities/musculoskeletal:  No swelling or varicosities noted, no clubbing or cyanosis ?Psych:  No mood changes, alert and cooperative,seems happy ?AA is 1 ?Fall risk is low ?Depression screen [facility] 2/9 07/16/2021 04/24/2020 03/21/2019  ?Decreased  Interest 0 1 0  ?Down, Depressed, Hopeless 0 1 0  ?PHQ - 2 Score 0 2 0  ?Altered sleeping 2 1 -  ?Tired, decreased energy 1 1 -  ?Change in appetite 0 0 -  ?Feeling bad or failure about yourself  0 1 -  ?Trouble concentrating 0 0 -  ?Moving slowly or fidgety/restless 0 0 -  ?Suicidal thoughts 0 0 -  ?PHQ-9 Score 3 5 -  ?  ?GAD 7 : Generalized Anxiety Score 07/16/2021 04/24/2020  ?Nervous, Anxious, on Edge 0 1  ?Control/stop worrying 1 1  ?Worry too much - different things 1 1  ?Trouble relaxing 1 1  ?Restless 1 0  ?Easily annoyed or irritable 1 1  ?Afraid - awful might happen 0 0  ?Total GAD 7 Score 5 5  ? ? Upstream - 07/16/21 1037   ? ?  ? Pregnancy Intention Screening  ? Does the patient want to become pregnant in the next year? No   ? Does the patient's partner want to become pregnant in the next year? No   ? Would the patient like to discuss contraceptive options today? No   ?  ? Contraception Wrap Up  ? Current Method Hormonal Injection   ? End Method Hormonal Injection   ? Contraception Counseling Provided No   ? ?  ?  ? ?  ?  ? Examination chaperoned by Jennifer Rogue LPN ? ?Impression and Plan: ?1. Encounter for gynecological examination with Papanicolaou smear of cervix ?Pap sent ?Physical in 1 year ?Pap in 3 if normal ? ?-  Cytology - PAP( [place]) ? ?2. Encounter for surveillance of injectable contraceptive ?Meds ordered this encounter  ?Medications  ? hydrochlorothiazide (MICROZIDE) 12.5 MG capsule  ?  Sig: Take 1 capsule (12.5 mg total) by mouth daily.  ?  Dispense:  30 capsule  ?  Refill:  3  ?  Order Specific Question:   Supervising Provider  ?  Answer:   Duane Lope H [[id number]]  ? medroxyPROGESTERone Acetate 150 MG/ML SUSY  ?  Sig: For injection every 3 months in office  ?  Dispense:  1 mL  ?  Refill:  3  ?  Order Specific Question:   Supervising Provider  ?  Answer:   Duane Lope H [[id number]]  ?  ? ? ?3. Encounter for screening fecal occult blood testing ? ?- POCT occult blood stool ? ?4. Hypertension,  unspecified type ?Will start on Microzide 12.5 mg 1 daily for BP ?Keep BP log ?Review DASH diet to decrease salt  ? ? ? ?  ?  ?

## 2021-07-21 ENCOUNTER — Other Ambulatory Visit: Payer: Self-pay

## 2021-07-21 ENCOUNTER — Ambulatory Visit (INDEPENDENT_AMBULATORY_CARE_PROVIDER_SITE_OTHER): Payer: Commercial Managed Care - PPO

## 2021-07-21 DIAGNOSIS — Z3042 Encounter for surveillance of injectable contraceptive: Secondary | ICD-10-CM | POA: Diagnosis not present

## 2021-07-21 LAB — CYTOLOGY - PAP
Adequacy: ABSENT
Comment: NEGATIVE
Comment: NEGATIVE
Diagnosis: NEGATIVE
HPV 16: NEGATIVE
HPV 18 / 45: POSITIVE — AB
High risk HPV: POSITIVE — AB

## 2021-07-21 MED ORDER — MEDROXYPROGESTERONE ACETATE 150 MG/ML IM SUSY: PREFILLED_SYRINGE | Freq: Once | INTRAMUSCULAR | Status: AC

## 2021-07-21 NOTE — Progress Notes (Signed)
? ?  NURSE VISIT- INJECTION ? ?SUBJECTIVE:  ?Jennifer Gonzales is a 41 y.o. G1P2002 female here for a Depo Provera for contraception/period management. She is a GYN patient.  ? ?OBJECTIVE:  ?There were no vitals taken for this visit.  ?Appears well, in no apparent distress ? ?Injection administered in: Left deltoid ? ?Meds ordered this encounter  ?Medications  ? medroxyPROGESTERone Acetate SUSY  ? ? ?ASSESSMENT: ?GYN patient Depo Provera for contraception/period management ?PLAN: ?Follow-up: in 11-13 weeks for next Depo  ? ?[name] A Tehila Sokolow  ?07/21/2021 ?9:48 AM  ?

## 2021-08-09 ENCOUNTER — Encounter: Payer: Commercial Managed Care - PPO | Admitting: Obstetrics & Gynecology

## 2021-09-06 ENCOUNTER — Encounter: Payer: Commercial Managed Care - PPO | Admitting: Obstetrics & Gynecology

## 2021-09-10 ENCOUNTER — Ambulatory Visit: Payer: Commercial Managed Care - PPO | Admitting: Adult Health

## 2021-09-16 ENCOUNTER — Encounter: Payer: Commercial Managed Care - PPO | Admitting: Obstetrics & Gynecology

## 2021-10-08 ENCOUNTER — Ambulatory Visit (INDEPENDENT_AMBULATORY_CARE_PROVIDER_SITE_OTHER): Payer: Commercial Managed Care - PPO | Admitting: *Deleted

## 2021-10-08 DIAGNOSIS — Z3042 Encounter for surveillance of injectable contraceptive: Secondary | ICD-10-CM | POA: Diagnosis not present

## 2021-10-08 MED ORDER — MEDROXYPROGESTERONE ACETATE 150 MG/ML IM SUSP
150.0000 mg | Freq: Once | INTRAMUSCULAR | Status: AC
  Administered 2021-10-08: 150 mg via INTRAMUSCULAR

## 2021-10-08 NOTE — Progress Notes (Signed)
   NURSE VISIT- INJECTION  SUBJECTIVE:  Jennifer Gonzales is a 41 y.o. G44P2002 female here for a Depo Provera for contraception/period management. She is a GYN patient.   OBJECTIVE:  There were no vitals taken for this visit.  Appears well, in no apparent distress  Injection administered in: Right deltoid  Meds ordered this encounter  Medications   medroxyPROGESTERone (DEPO-PROVERA) injection 150 mg    ASSESSMENT: GYN patient Depo Provera for contraception/period management PLAN: Follow-up: in 11-13 weeks for next Depo   Malachy Mood  10/08/2021 9:56 AM

## 2021-12-31 ENCOUNTER — Other Ambulatory Visit: Payer: Self-pay | Admitting: *Deleted

## 2021-12-31 ENCOUNTER — Ambulatory Visit (INDEPENDENT_AMBULATORY_CARE_PROVIDER_SITE_OTHER): Payer: Commercial Managed Care - PPO | Admitting: *Deleted

## 2021-12-31 DIAGNOSIS — Z3042 Encounter for surveillance of injectable contraceptive: Secondary | ICD-10-CM | POA: Diagnosis not present

## 2021-12-31 MED ORDER — MEDROXYPROGESTERONE ACETATE 150 MG/ML IM SUSP: 150.0000 mg | Freq: Once | INTRAMUSCULAR | Status: DC

## 2021-12-31 MED ORDER — MEDROXYPROGESTERONE ACETATE 150 MG/ML IM SUSP
150.0000 mg | Freq: Once | INTRAMUSCULAR | 0 refills | Status: DC
Start: 2021-12-31 — End: 2022-09-09

## 2021-12-31 MED ORDER — MEDROXYPROGESTERONE ACETATE 150 MG/ML IM SUSP
150.0000 mg | Freq: Once | INTRAMUSCULAR | Status: AC
  Administered 2021-12-31: 150 mg via INTRAMUSCULAR

## 2021-12-31 NOTE — Progress Notes (Signed)
   NURSE VISIT- INJECTION  SUBJECTIVE:  Jennifer Gonzales is a 41 y.o. G51P2002 female here for a Depo Provera for contraception/period management. She is a GYN patient.   OBJECTIVE:  There were no vitals taken for this visit.  Appears well, in no apparent distress  Injection administered in: Left deltoid  Meds ordered this encounter  Medications   medroxyPROGESTERone (DEPO-PROVERA) injection 150 mg   DISCONTD: medroxyPROGESTERone (DEPO-PROVERA) injection 150 mg    ASSESSMENT: GYN patient Depo Provera for contraception/period management PLAN: Follow-up: in 11-13 weeks for next Depo   Malachy Mood  12/31/2021 10:58 AM

## 2022-03-25 ENCOUNTER — Ambulatory Visit (INDEPENDENT_AMBULATORY_CARE_PROVIDER_SITE_OTHER): Payer: Commercial Managed Care - PPO | Admitting: *Deleted

## 2022-03-25 DIAGNOSIS — Z3042 Encounter for surveillance of injectable contraceptive: Secondary | ICD-10-CM | POA: Diagnosis not present

## 2022-03-25 MED ORDER — MEDROXYPROGESTERONE ACETATE 150 MG/ML IM SUSP
150.0000 mg | Freq: Once | INTRAMUSCULAR | Status: AC
  Administered 2022-03-25: 150 mg via INTRAMUSCULAR

## 2022-03-25 NOTE — Progress Notes (Signed)
   NURSE VISIT- INJECTION  SUBJECTIVE:  Jennifer Gonzales is a 40 y.o. G52P2002 female here for a Depo Provera for contraception/period management. She is a GYN patient.   OBJECTIVE:  There were no vitals taken for this visit.  Appears well, in no apparent distress  Injection administered in: Right deltoid  Meds ordered this encounter  Medications   medroxyPROGESTERone (DEPO-PROVERA) injection 150 mg    ASSESSMENT: GYN patient Depo Provera for contraception/period management PLAN: Follow-up: in 11-13 weeks for next Depo   Annamarie Dawley  03/25/2022 12:15 PM

## 2022-06-17 ENCOUNTER — Ambulatory Visit (INDEPENDENT_AMBULATORY_CARE_PROVIDER_SITE_OTHER): Payer: Commercial Managed Care - PPO | Admitting: *Deleted

## 2022-06-17 DIAGNOSIS — Z3042 Encounter for surveillance of injectable contraceptive: Secondary | ICD-10-CM

## 2022-06-17 MED ORDER — MEDROXYPROGESTERONE ACETATE 150 MG/ML IM SUSP
150.0000 mg | Freq: Once | INTRAMUSCULAR | Status: AC
  Administered 2022-06-17: 150 mg via INTRAMUSCULAR

## 2022-06-17 NOTE — Progress Notes (Signed)
   NURSE VISIT- INJECTION  SUBJECTIVE:  Jennifer Gonzales is a 42 y.o. G33P2002 female here for a Depo Provera for contraception/period management. She is a GYN patient.   OBJECTIVE:  There were no vitals taken for this visit.  Appears well, in no apparent distress  Injection administered in: Left deltoid  Meds ordered this encounter  Medications   medroxyPROGESTERone (DEPO-PROVERA) injection 150 mg    ASSESSMENT: GYN patient Depo Provera for contraception/period management PLAN: Follow-up: in 11-13 weeks for next Depo   Alice Rieger  06/17/2022 10:36 AM

## 2022-08-17 ENCOUNTER — Ambulatory Visit: Payer: Commercial Managed Care - PPO | Admitting: Adult Health

## 2022-09-08 ENCOUNTER — Other Ambulatory Visit: Payer: Self-pay | Admitting: Adult Health

## 2022-09-09 ENCOUNTER — Ambulatory Visit (INDEPENDENT_AMBULATORY_CARE_PROVIDER_SITE_OTHER): Payer: Commercial Managed Care - PPO | Admitting: *Deleted

## 2022-09-09 DIAGNOSIS — Z3042 Encounter for surveillance of injectable contraceptive: Secondary | ICD-10-CM | POA: Diagnosis not present

## 2022-09-09 MED ORDER — MEDROXYPROGESTERONE ACETATE 150 MG/ML IM SUSP
150.0000 mg | Freq: Once | INTRAMUSCULAR | Status: AC
  Administered 2022-09-09: 150 mg via INTRAMUSCULAR

## 2022-09-09 NOTE — Progress Notes (Signed)
   NURSE VISIT- INJECTION  SUBJECTIVE:  Jennifer Gonzales is a 42 y.o. G11P2002 female here for a Depo Provera for contraception/period management. She is a GYN patient.   OBJECTIVE:  There were no vitals taken for this visit.  Appears well, in no apparent distress  Injection administered in: Right deltoid  Meds ordered this encounter  Medications   medroxyPROGESTERone (DEPO-PROVERA) injection 150 mg    ASSESSMENT: GYN patient Depo Provera for contraception/period management PLAN: Follow-up: in 11-13 weeks for next Depo   Jobe Marker  09/09/2022 10:55 AM

## 2022-09-13 IMAGING — DX DG CHEST 1V PORT
1 series · 1 of 1 positions shown · non-contrast
Comparison: 09/24/2019

CLINICAL DATA: Shortness of breath

EXAM:
PORTABLE CHEST 1 VIEW

[chest ap]
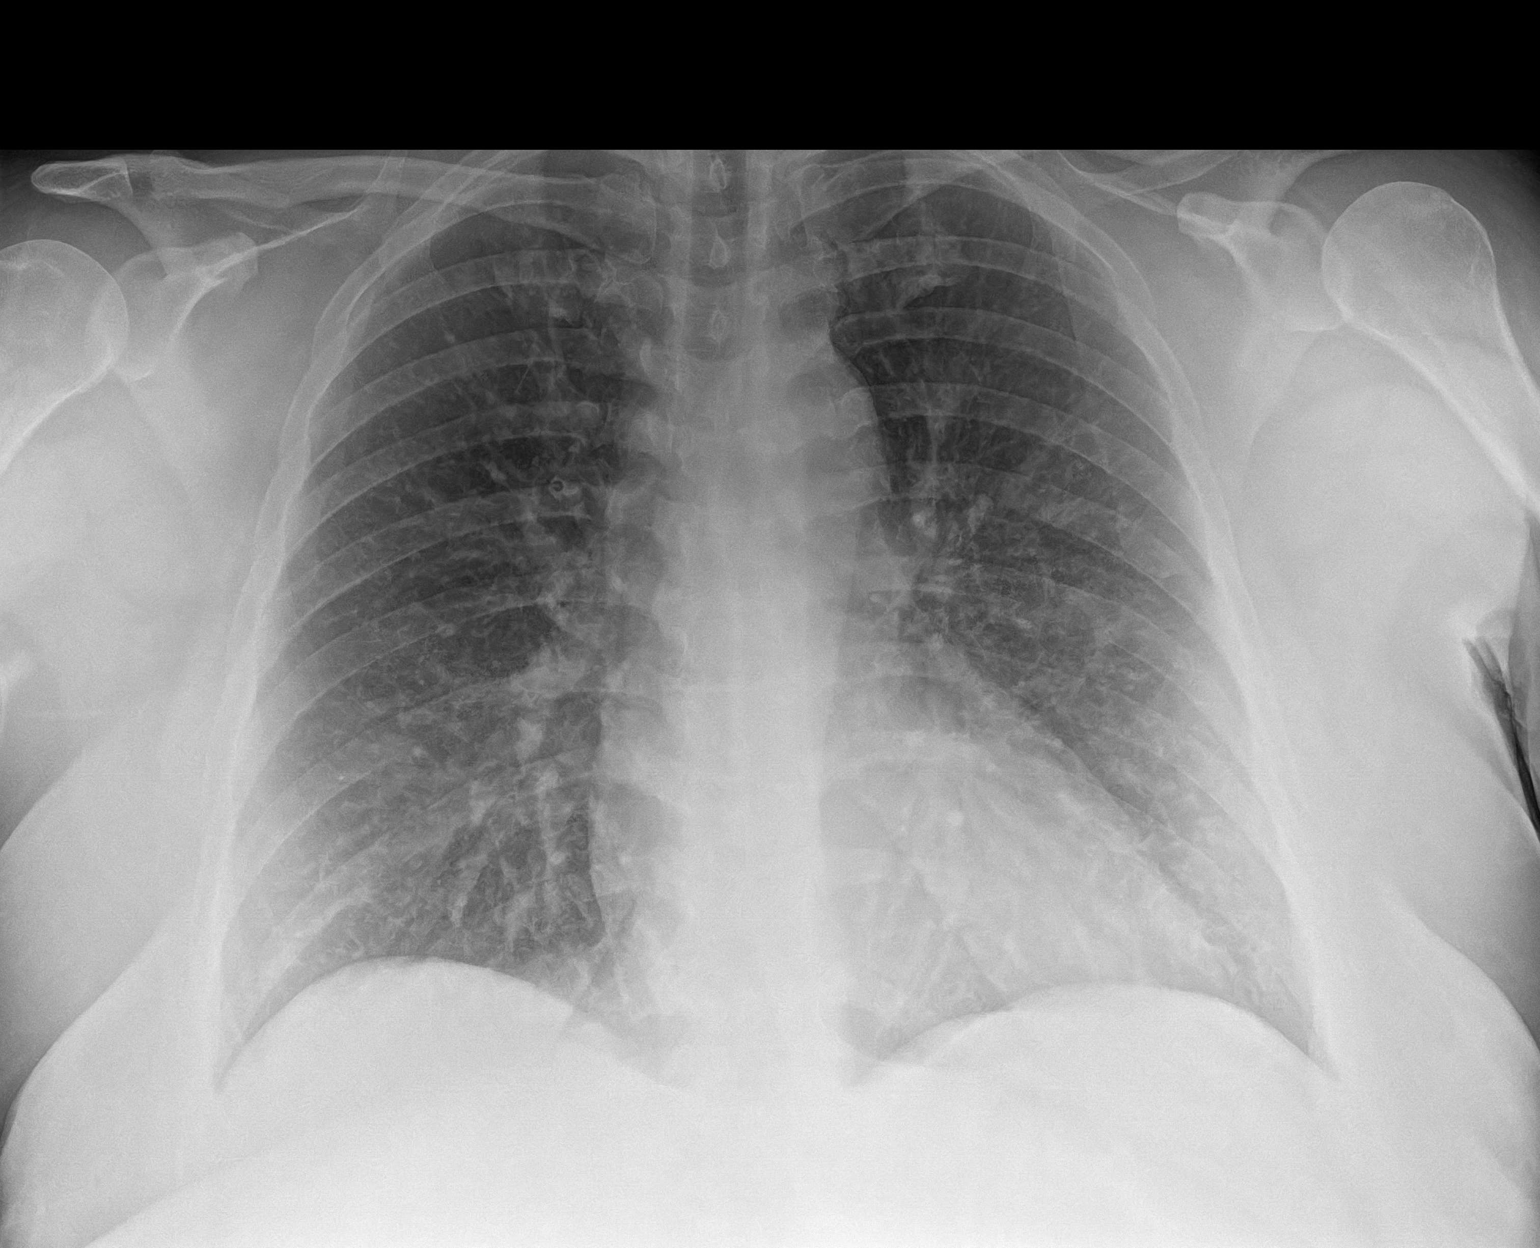

[1 of 1 positions shown; findings below may reference images not displayed]

FINDINGS: Chronic mild interstitial opacity. No focal consolidation, pleural
effusion or pneumothorax. Normal cardiomediastinal silhouette.
IMPRESSION: No active disease.

## 2022-11-09 ENCOUNTER — Ambulatory Visit: Payer: Commercial Managed Care - PPO | Admitting: Adult Health

## 2022-12-02 ENCOUNTER — Ambulatory Visit (INDEPENDENT_AMBULATORY_CARE_PROVIDER_SITE_OTHER): Payer: Commercial Managed Care - PPO | Admitting: *Deleted

## 2022-12-02 DIAGNOSIS — Z3042 Encounter for surveillance of injectable contraceptive: Secondary | ICD-10-CM | POA: Diagnosis not present

## 2022-12-02 MED ORDER — MEDROXYPROGESTERONE ACETATE 150 MG/ML IM SUSY
150.0000 mg | PREFILLED_SYRINGE | Freq: Once | INTRAMUSCULAR | Status: AC
  Administered 2022-12-02: 150 mg via INTRAMUSCULAR

## 2022-12-02 NOTE — Progress Notes (Signed)
   NURSE VISIT- INJECTION  SUBJECTIVE:  Jennifer Gonzales is a 41 y.o. G39P2002 female here for a Depo Provera for contraception/period management. She is a GYN patient.   OBJECTIVE:  There were no vitals taken for this visit.  Appears well, in no apparent distress  Injection administered in: Left deltoid  Meds ordered this encounter  Medications   medroxyPROGESTERone Acetate SUSY 150 mg    ASSESSMENT: GYN patient Depo Provera for contraception/period management PLAN: Follow-up: in 11-13 weeks for next Depo   Malachy Mood  12/02/2022 10:01 AM

## 2023-02-21 ENCOUNTER — Ambulatory Visit (HOSPITAL_COMMUNITY)
Admission: RE | Admit: 2023-02-21 | Discharge: 2023-02-21 | Disposition: A | Discharge status: Home / Self Care | Payer: Commercial Managed Care - PPO | Source: Ambulatory Visit | Attending: Gerontology | Admitting: Gerontology

## 2023-02-21 ENCOUNTER — Other Ambulatory Visit (HOSPITAL_COMMUNITY): Payer: Self-pay | Admitting: Gerontology

## 2023-02-21 DIAGNOSIS — R52 Pain, unspecified: Secondary | ICD-10-CM

## 2023-02-24 ENCOUNTER — Ambulatory Visit: Payer: Commercial Managed Care - PPO | Admitting: *Deleted

## 2023-02-24 DIAGNOSIS — Z3042 Encounter for surveillance of injectable contraceptive: Secondary | ICD-10-CM

## 2023-02-24 MED ORDER — MEDROXYPROGESTERONE ACETATE 150 MG/ML IM SUSP
150.0000 mg | Freq: Once | INTRAMUSCULAR | Status: AC
  Administered 2023-02-24: 150 mg via INTRAMUSCULAR

## 2023-02-24 NOTE — Progress Notes (Signed)
   NURSE VISIT- INJECTION  SUBJECTIVE:  Jennifer Gonzales is a 42 y.o. G107P2002 female here for a Depo Provera for contraception/period management. She is a GYN patient.   OBJECTIVE:  There were no vitals taken for this visit.  Appears well, in no apparent distress  Injection administered in: Right deltoid  Meds ordered this encounter  Medications   medroxyPROGESTERone (DEPO-PROVERA) injection 150 mg    ASSESSMENT: GYN patient Depo Provera for contraception/period management PLAN: Follow-up: in 11-13 weeks for next Depo   Jobe Marker  02/24/2023 9:56 AM

## 2023-03-15 ENCOUNTER — Encounter: Payer: Self-pay | Admitting: Orthopedic Surgery

## 2023-03-15 ENCOUNTER — Ambulatory Visit (INDEPENDENT_AMBULATORY_CARE_PROVIDER_SITE_OTHER): Payer: Commercial Managed Care - PPO | Admitting: Orthopedic Surgery

## 2023-03-15 VITALS — BP 160/108 | HR 99 | Ht 59.5 in | Wt 275.0 lb

## 2023-03-15 DIAGNOSIS — M79672 Pain in left foot: Secondary | ICD-10-CM

## 2023-03-15 DIAGNOSIS — G8929 Other chronic pain: Secondary | ICD-10-CM

## 2023-03-15 DIAGNOSIS — M25562 Pain in left knee: Secondary | ICD-10-CM

## 2023-03-15 DIAGNOSIS — M25362 Other instability, left knee: Secondary | ICD-10-CM | POA: Diagnosis not present

## 2023-03-15 NOTE — Progress Notes (Signed)
New Patient Visit  Assessment: Jennifer Gonzales is a 42 y.o. female with the following: 1. Chronic pain of left knee 2. Instability of left knee joint 3. Pain of left heel  Plan: Jennifer Gonzales has chronic pain in her left knee.  It is causing buckling sensations.  She has fallen several times.  The description of her ongoing pain, as well as the appearance of her knee has me concerned that she has sustained some patellar subluxations.  She has not had a frank dislocation.  She also has tenderness to palpation along the medial joint line, which could be consistent with a meniscus injury.  She has worked with physical therapy.  She is tried medications, as well as activity modifications, all without improvements in her overall symptoms.  It is affecting her work.  She is currently unable to fulfill all of her duties at work.  She states that she thoroughly enjoys her job, and does not want to find another job.  Based on all of this, I am recommending an MRI of the left knee.  Once the MRI is complete, she will return to clinic for repeat evaluation.  We will continue to assess her left heel pain as well, as she may have some plantar fasciitis.  Follow-up: Return for After MRI.  Subjective:  Chief Complaint  Patient presents with   Leg Pain    History of Present Illness: Jennifer Gonzales is a 42 y.o. female who has been referred by  for evaluation of left knee and left foot pain.  The knee is a greater issue.  This has been ongoing for several years.  She was evaluated many years ago, and diagnosed with a meniscus injury.  She has not had an MRI.  She has had several falls due to buckling in the left knee.  She routinely impacts the anterior aspect of the left knee.  She currently has pain along the medial aspect of the left knee, with some associated swelling.  She has to wear a brace, otherwise she has difficulty walking.  She has been taking NSAIDs, with limited improvement in her symptoms.   She has worked with physical therapy multiple times.  She continues to have difficulty, and is frustrated.  She is unable to complete all tasks at work.  She has pain in the heel of the left foot, which radiates into her calf muscle.  The pain is getting worse.  She has been wearing a brace which does help.  The pain in the left knee, is more concerning than the left foot.   Review of Systems: No fevers or chills No numbness or tingling No chest pain No shortness of breath No bowel or bladder dysfunction No GI distress No headaches   Medical History:  Past Medical History:  Diagnosis Date   Back pain    BV (bacterial vaginosis)    Cervical high risk human papillomavirus (HPV) DNA test positive 12/05/2016   Fibromyalgia    Hx of migraines    Hypertension    Mental disorder    Papanicolaou smear of cervix with positive high risk human papilloma virus (HPV) test 04/02/2019   03/2019, +HPV 18 needs colpo___   Vaginal Pap smear, abnormal     Past Surgical History:  Procedure Laterality Date   BACK SURGERY     CESAREAN SECTION     CHOLECYSTECTOMY     TONSILLECTOMY      Family History  Problem Relation Age of Onset  Hypertension Other    Diabetes Other    Stroke Other    Heart disease Other    Cancer Paternal Grandmother        breast   Diabetes Father    Muscular dystrophy Mother    Muscular dystrophy Brother    Muscular dystrophy Sister    Muscular dystrophy Sister    Asthma Son    Other Son        allergies   Social History   Tobacco Use   Smoking status: Every Day    Current packs/day: 0.25    Average packs/day: 0.3 packs/day for 23.0 years (5.8 ttl pk-yrs)    Types: Cigarettes   Smokeless tobacco: Never  Vaping Use   Vaping status: Former  Substance Use Topics   Alcohol use: Yes   Drug use: No    Allergies  Allergen Reactions   Ketorolac Tromethamine Other (See Comments)    Toradol-REACTION: Hallucinations, alters mental status   Sulfa  Antibiotics Nausea And Vomiting    No outpatient medications have been marked as taking for the 03/15/23 encounter (Office Visit) with Oliver Barre, MD.    Objective: BP (!) 160/108   Pulse 99   Ht 4' 11.5" (1.511 m)   Wt 275 lb (124.7 kg)   BMI 54.61 kg/m   Physical Exam:  General: Alert and oriented. and No acute distress. Gait: Left sided antalgic gait.  Evaluation left knee demonstrates bruising and swelling of the anterior knee.  She has a healed blister in the superior aspect of the patella.  She is able to achieve full extension.  She is tenderness to palpation along the medial joint line.  She has pain with flexion beyond 90 degrees.  Her patella is mobile, without an obvious buttress.  She has tenderness along the medial aspect of the patella.  IMAGING: I personally reviewed images previously obtained in clinic  X-rays of the left foot, as well as left knee were previously obtained.  No acute injuries.   New Medications:  No orders of the defined types were placed in this encounter.     Oliver Barre, MD  03/15/2023 8:54 AM

## 2023-03-15 NOTE — Patient Instructions (Signed)
Please provide a note for work   Patient should continue to wear a knee brace while at work.  She should not go up and down stairs until she is re-evaluated in clinic, after obtaining an MRI.

## 2023-03-27 ENCOUNTER — Telehealth: Payer: Self-pay | Admitting: Orthopedic Surgery

## 2023-03-27 NOTE — Telephone Encounter (Signed)
Dr. Dallas Schimke pt - spoke w/the pt, she stated that she hasn't heard anything about scheduling her MRI.  I explained that typically the pt calls to schedule it, but she stated she was told someone would call her due to something w/her insurance.  She had a f/u for MRI scheduled, but I cancelled this w/the pt as she hasn't been for the MRI and doesn't have an appt yet.

## 2023-04-05 ENCOUNTER — Ambulatory Visit: Payer: Commercial Managed Care - PPO | Admitting: Orthopedic Surgery

## 2023-04-18 ENCOUNTER — Ambulatory Visit (INDEPENDENT_AMBULATORY_CARE_PROVIDER_SITE_OTHER): Payer: Commercial Managed Care - PPO | Admitting: Orthopedic Surgery

## 2023-04-18 ENCOUNTER — Encounter: Payer: Self-pay | Admitting: Orthopedic Surgery

## 2023-04-18 DIAGNOSIS — G8929 Other chronic pain: Secondary | ICD-10-CM | POA: Diagnosis not present

## 2023-04-18 DIAGNOSIS — M25362 Other instability, left knee: Secondary | ICD-10-CM

## 2023-04-18 DIAGNOSIS — M25562 Pain in left knee: Secondary | ICD-10-CM | POA: Diagnosis not present

## 2023-04-18 NOTE — Progress Notes (Signed)
Return patient Visit  Assessment: Jennifer Gonzales is a 42 y.o. female with the following: 1. Chronic pain of left knee  Plan: Jennifer Gonzales has chronic pain in her left knee.  MRI without operative indication.  Mild to moderate degenerative changes.  Low-grade partial tear of the ACL.  We discussed the nature of her ongoing pain, as well as the MRI findings.  No indication for surgery.  I would like for her to start physical therapy.  In addition, we completed a left knee injection in clinic today.  Procedure note injection Left knee joint   Verbal consent was obtained to inject the left knee joint  Timeout was completed to confirm the site of injection.  The skin was prepped with alcohol and ethyl chloride was sprayed at the injection site.  A 21-gauge needle was used to inject 40 mg of Depo-Medrol and 1% lidocaine (4 cc) into the left knee using an anterolateral approach.  There were no complications. A sterile bandage was applied.    Follow-up: Return in about 2 months (around 06/19/2023).  Subjective:  Chief Complaint  Patient presents with   Knee Pain    L knee MRI results.     History of Present Illness: Jennifer Gonzales is a 42 y.o. female who returns to clinic today for repeat evaluation of left knee pain.  She has had chronic knee pain in her left knee for years.  More recently, she had a fall, approximately 6 months ago.  Since then, she has had a lot of sharp pains in the left knee.  Occasional swelling.  She notes popping and grinding, which is uncomfortable for her.  She has not worked with physical therapy in a couple of years.  She has tried NSAIDs.  She has not had an injection recently.  She had an MRI, and is here to discuss the findings.  Review of Systems: No fevers or chills No numbness or tingling No chest pain No shortness of breath No bowel or bladder dysfunction No GI distress No headaches      Objective: There were no vitals taken for this  visit.  Physical Exam:  General: Alert and oriented. and No acute distress. Gait: Left sided antalgic gait.  Evaluation left knee demonstrates bruising and swelling of the anterior knee.  She has a healed blister in the superior aspect of the patella.  She is able to achieve full extension.  She is tenderness to palpation along the medial joint line.  She has pain with flexion beyond 90 degrees.  Negative Lachman.  IMAGING: I personally reviewed images previously obtained in clinic  Left knee MRI, 04/07/2023  Impression: 1.  Acute versus chronic sprain or previous low-grade partial tear of the ACL 2.  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  Moderate knee joint effusion with small medial and suprapatellar plica and trace popliteal cyst 4.  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  Mild popliteus tendinosis  New Medications:  No orders of the defined types were placed in this encounter.     Oliver Barre, MD  04/18/2023 9:24 AM

## 2023-04-18 NOTE — Patient Instructions (Addendum)
Please provide a note for work - no stairs for the next month    Instructions Following Joint Injections  In clinic today, you received an injection in one of your joints (sometimes more than one).  Occasionally, you can have some pain at the injection site, this is normal.  You can place ice at the injection site, or take over-the-counter medications such as Tylenol (acetaminophen) or Advil (ibuprofen).  Please follow all directions listed on the bottle.  If your joint (knee or shoulder) becomes swollen, red or very painful, please contact the clinic for additional assistance.   Two medications were injected, including lidocaine and a steroid (often referred to as cortisone).  Lidocaine is effective almost immediately but wears off quickly.  However, the steroid can take a few days to improve your symptoms.  In some cases, it can make your pain worse for a couple of days.  Do not be concerned if this happens as it is common.  You can apply ice or take some over-the-counter medications as needed.   Injections in the same joint cannot be repeated for 3 months.  This helps to limit the risk of an infection in the joint.  If you were to develop an infection in your joint, the best treatment option would be surgery.

## 2023-05-01 NOTE — Therapy (Signed)
OUTPATIENT PHYSICAL THERAPY LOWER EXTREMITY EVALUATION   Patient Name: Jennifer Gonzales MRN: [id number] DOB:[birth year], 42 y.o., female Today's Date: 05/02/2023  END OF SESSION:  PT End of Session - 05/02/23 1153     Visit Number 1    Date for PT Re-Evaluation 06/13/23    Authorization Type UHC    Authorization Time Period 30 visit total; 20 remaining, no auth    Authorization - Visit Number 1    Authorization - Number of Visits 20    Progress Note Due on Visit 6    PT Start Time 1015    PT Stop Time 1053    PT Time Calculation (min) 38 min    Activity Tolerance Patient tolerated treatment well    Behavior During Therapy WFL for tasks assessed/performed             Past Medical History:  Diagnosis Date   Back pain    BV (bacterial vaginosis)    Cervical high risk human papillomavirus (HPV) DNA test positive 12/05/2016   Fibromyalgia    Hx of migraines    Hypertension    Mental disorder    Papanicolaou smear of cervix with positive high risk human papilloma virus (HPV) test 04/02/2019   03/2019, +HPV 18 needs colpo___   Vaginal Pap smear, abnormal    Past Surgical History:  Procedure Laterality Date   BACK SURGERY     CESAREAN SECTION     CHOLECYSTECTOMY     TONSILLECTOMY     Patient Active Problem List   Diagnosis Date Noted   Encounter for screening fecal occult blood testing 07/16/2021   Hypertension 07/16/2021   Encounter for gynecological examination with Papanicolaou smear of cervix 04/24/2020   Encounter for surveillance of injectable contraceptive 04/24/2020   Weight gain 04/24/2020   Hot flashes 04/24/2020   Smoker 04/24/2020   Papanicolaou smear of cervix with positive high risk human papilloma virus (HPV) test 04/02/2019   Cervical high risk human papillomavirus (HPV) DNA test positive 12/05/2016   Hyperlipemia 04/05/2015   Esophageal reflux 04/05/2015   Obesity, unspecified 04/05/2015   Hx of migraines 07/28/2012   Depression 07/28/2012    Back pain 07/28/2012   Fibromyalgia 07/28/2012   BV (bacterial vaginosis) 07/28/2012    PCP: Benetta Spar, MD  REFERRING PROVIDER: Thane Edu MD  REFERRING DIAG:  [phone] (ICD-10-CM) - Chronic pain of left knee  M25.362 (ICD-10-CM) - Instability of left knee joint    THERAPY DIAG:  Chronic pain of left knee  Muscle weakness (generalized)  Instability of left knee joint  Rationale for Evaluation and Treatment: Rehabilitation  ONSET DATE: ~ 5 years ago  SUBJECTIVE:   SUBJECTIVE STATEMENT: Pt had initial injury five year ago dancing. Minor tear in meniscus. In last year, pt took bad fall at work with direct impact to knee. In August, slipped in water and re-injured knee. Previous 3 days pt reports popping and extreme pain. Works 12 hour shifts at [employer] center and is walking > 10K feet. Trouble walking on gravel surfaces and as been wearing knee brace since September.  PERTINENT HISTORY: Back Fusion PAIN:  Are you having pain? Yes: NPRS scale: 2.5/10 Pain location: medial left knee Pain description: Sharp Aggravating factors: walking, squatting Relieving factors: NSAIDs  PRECAUTIONS: None  RED FLAGS: None   WEIGHT BEARING RESTRICTIONS: No  FALLS:  Has patient fallen in last 6 months?  Yes but related to external factor   PATIENT GOALS: Pt wants to return back  to dance.  NEXT MD VISIT: February  OBJECTIVE:  Note: Objective measures were completed at Evaluation unless otherwise noted.  DIAGNOSTIC FINDINGS:  Left knee MRI, 04/07/2023   Impression: 1.  Acute versus chronic sprain or previous low-grade partial tear of the ACL 2.  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  Moderate knee joint effusion with small medial and suprapatellar plica and trace popliteal cyst 4.  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  Mild  popliteus tendinosis    PATIENT SURVEYS:  LEFS 41/80 = 51.2%  COGNITION: Overall cognitive status: Within functional limits for tasks assessed     SENSATION: WFL  POSTURE: increased lumbar lordosis  PALPATION: TTP along medial and lateral sides of patella  LOWER EXTREMITY ROM:  Active ROM Right eval Left eval  Hip flexion    Hip extension    Hip abduction    Hip adduction    Hip internal rotation    Hip external rotation    Knee flexion WFL 110  Knee extension 0 0  Ankle dorsiflexion    Ankle plantarflexion    Ankle inversion    Ankle eversion     (Blank rows = not tested)  LOWER EXTREMITY MMT:  MMT Right eval Left eval  Hip flexion 4+ 4-  Hip extension 4- 4-  Hip abduction 4- 4-  Hip adduction    Hip internal rotation    Hip external rotation    Knee flexion 4- 4-  Knee extension 5 4-  Ankle dorsiflexion 5 4+  Ankle plantarflexion    Ankle inversion    Ankle eversion     (Blank rows = not tested)    FUNCTIONAL TESTS:  30 seconds chair stand test: 12x with R lateral weight shift 2 minute walk test: 462ft  GAIT: Distance walked: 464ft Assistive device utilized: None Level of assistance: Complete Independence Comments: Wide base of support due to body habitus.  Stairs:  4x 7in stairs; Alternating acsending with single UE support. Descending- stepto leading RLE with BUE support.   TODAY'S TREATMENT:                                                                                                                              DATE: 05/02/2023 PT Evaluation    PATIENT EDUCATION:  Education details: PT Evaluation, findings, prognosis, frequency, attendance policy, and HEP if given.  Person educated: Patient Education method: Medical illustrator Education comprehension: verbalized understanding  HOME EXERCISE PROGRAM: Access Code: [email or web address] URL: [email or web address] Date: 05/02/2023 Prepared by: Starling Manns  Exercises - Wall Quarter Squat  - 1 x daily - 7 x weekly - 3 sets - 10 reps - 20 hold - Standing Knee Flexion  - 1 x daily - 7 x weekly - 3 sets - 10 reps - Standing Hip Abduction with Counter Support  - 1 x daily - 7 x weekly -  3 sets - 10 reps - Step Up  - 1 x daily - 7 x weekly - 3 sets - 10 reps  ASSESSMENT:  CLINICAL IMPRESSION: Patient is a 42 y.o. female who was seen today for physical therapy evaluation and treatment for  M25.562,G89.29 (ICD-10-CM) - Chronic pain of left knee  M25.362 (ICD-10-CM) - Instability of left knee joint   Pt is demonstrating limitations in functional use of LLE during transfers, ambulation, job duties, ADLs and recreational activities due to LLE muscle weakness, left knee ROM deficits, pain, in setting of chronic injuries and increased inflammatory state of surrounding musculature. Increased inflammatory response noted by consistent tendinosis in MRI findings. Pt will benefit from skilled Physical Therapy services to address deficits/limitations in order to improve functional and QOL.    OBJECTIVE IMPAIRMENTS: Abnormal gait, difficulty walking, decreased ROM, decreased strength, improper body mechanics, and postural dysfunction.   ACTIVITY LIMITATIONS: carrying, lifting, standing, squatting, stairs, transfers, and locomotion level  PARTICIPATION LIMITATIONS: driving, community activity, occupation, and yard work  PERSONAL FACTORS: Age are also affecting patient's functional outcome.   REHAB POTENTIAL: Good  CLINICAL DECISION MAKING: Stable/uncomplicated  EVALUATION COMPLEXITY: Low   GOALS: Goals reviewed with patient? No  SHORT TERM GOALS: Target date: 05/23/2023  Pt will be independent with HEP in order to demonstrate participation in Physical Therapy POC.  Baseline: Goal status: INITIAL  2.  Pt will 1/10 pain during mobility in order to demonstrate improved pain with functional activities.  Baseline:  Goal status: INITIAL  LONG  TERM GOALS: Target date: 06/13/2023  Pt will improve 30 second chair test by 2 in order to demonstrate improved functional strength to return to desired activities.  Baseline: See objective.  Goal status: INITIAL  2.  Pt will perform 4 symmetrical, alternating descending stair negotiation in order to demonstrate increased eccentric control of L quadriceps without zero reports of pain or instability. Baseline: See objective.  Goal status: INITIAL  3.  Pt will improve LEFS score by 10 points in order to demonstrate improved pain with functional goals and outcomes. Baseline: See objective.  Goal status: INITIAL  4.  Pt will improve LLE MMT by >1/2 grade in order to reduce pain during functional ambulatory activities. Baseline: See objective.  Goal status: INITIAL  5.  Pt will report ability to participate in at least one time of dancing/recreational activities in order to demonstrate reduced Left knee pain. Baseline: See objective.  Goal status: INITIAL   PLAN:  PT FREQUENCY: 1x/week  PT DURATION: 6 weeks  PLANNED INTERVENTIONS: 97164- PT Re-evaluation, 97110-Therapeutic exercises, 97530- Therapeutic activity, 97112- Neuromuscular re-education, 97535- Self Care, 78295- Manual therapy, and 97116- Gait training  PLAN FOR NEXT SESSION: quadriceps isometric training, hamstring strengthening, stair training, wants to get back to dancing.   Nelida Meuse PT, DPT [facility]- [facility] [phone] office   Nelida Meuse, PT 05/02/2023, 11:55 AM

## 2023-05-02 ENCOUNTER — Ambulatory Visit (HOSPITAL_COMMUNITY): Payer: Commercial Managed Care - PPO | Attending: Orthopedic Surgery

## 2023-05-02 ENCOUNTER — Encounter (HOSPITAL_COMMUNITY): Payer: Self-pay

## 2023-05-02 ENCOUNTER — Other Ambulatory Visit: Payer: Self-pay

## 2023-05-02 DIAGNOSIS — M25362 Other instability, left knee: Secondary | ICD-10-CM | POA: Insufficient documentation

## 2023-05-02 DIAGNOSIS — M25562 Pain in left knee: Secondary | ICD-10-CM | POA: Diagnosis present

## 2023-05-02 DIAGNOSIS — M6281 Muscle weakness (generalized): Secondary | ICD-10-CM | POA: Insufficient documentation

## 2023-05-02 DIAGNOSIS — G8929 Other chronic pain: Secondary | ICD-10-CM | POA: Diagnosis present

## 2023-05-15 ENCOUNTER — Ambulatory Visit (HOSPITAL_COMMUNITY): Payer: Commercial Managed Care - PPO

## 2023-05-15 ENCOUNTER — Encounter (HOSPITAL_COMMUNITY): Payer: Self-pay

## 2023-05-15 DIAGNOSIS — M6281 Muscle weakness (generalized): Secondary | ICD-10-CM

## 2023-05-15 DIAGNOSIS — G8929 Other chronic pain: Secondary | ICD-10-CM

## 2023-05-15 DIAGNOSIS — M25362 Other instability, left knee: Secondary | ICD-10-CM

## 2023-05-15 DIAGNOSIS — M25562 Pain in left knee: Secondary | ICD-10-CM | POA: Diagnosis not present

## 2023-05-15 NOTE — Therapy (Signed)
OUTPATIENT PHYSICAL THERAPY LOWER EXTREMITY EVALUATION   Patient Name: Jennifer Gonzales MRN: [id number] DOB:1980-08-28, 42 y.o., female Today's Date: 05/15/2023  END OF SESSION:  PT End of Session - 05/15/23 0927     Visit Number 2    Date for PT Re-Evaluation 06/13/23    Authorization Type UHC    Authorization Time Period 30 visit total; 20 remaining, no auth    Authorization - Visit Number 2    Authorization - Number of Visits 20    Progress Note Due on Visit 6    PT Start Time 0845    PT Stop Time 0925    PT Time Calculation (min) 40 min    Activity Tolerance Patient tolerated treatment well    Behavior During Therapy [facility] for tasks assessed/performed              Past Medical History:  Diagnosis Date   Back pain    BV (bacterial vaginosis)    Cervical high risk human papillomavirus (HPV) DNA test positive 12/05/2016   Fibromyalgia    Hx of migraines    Hypertension    Mental disorder    Papanicolaou smear of cervix with positive high risk human papilloma virus (HPV) test 04/02/2019   03/2019, +HPV 18 needs colpo___   Vaginal Pap smear, abnormal    Past Surgical History:  Procedure Laterality Date   BACK SURGERY     CESAREAN SECTION     CHOLECYSTECTOMY     TONSILLECTOMY     Patient Active Problem List   Diagnosis Date Noted   Encounter for screening fecal occult blood testing 07/16/2021   Hypertension 07/16/2021   Encounter for gynecological examination with Papanicolaou smear of cervix 04/24/2020   Encounter for surveillance of injectable contraceptive 04/24/2020   Weight gain 04/24/2020   Hot flashes 04/24/2020   Smoker 04/24/2020   Papanicolaou smear of cervix with positive high risk human papilloma virus (HPV) test 04/02/2019   Cervical high risk human papillomavirus (HPV) DNA test positive 12/05/2016   Hyperlipemia 04/05/2015   Esophageal reflux 04/05/2015   Obesity, unspecified 04/05/2015   Hx of migraines 07/28/2012   Depression 07/28/2012    Back pain 07/28/2012   Fibromyalgia 07/28/2012   BV (bacterial vaginosis) 07/28/2012    PCP: Benetta Spar, MD  REFERRING PROVIDER: Thane Edu MD  REFERRING DIAG:  [phone] (ICD-10-CM) - Chronic pain of left knee  M25.362 (ICD-10-CM) - Instability of left knee joint    THERAPY DIAG:  Chronic pain of left knee  Muscle weakness (generalized)  Instability of left knee joint  Rationale for Evaluation and Treatment: Rehabilitation  ONSET DATE: ~ 5 years ago  SUBJECTIVE:   SUBJECTIVE STATEMENT: Pt reports she is having trouble with wall squats, too much pain in the anterior aspect of patella. Pt reports falling this morning reaching for luandry basket. Current pain level is 3/10. Pt reports worst pain has been popping and grinding and induces nausea.  PERTINENT HISTORY: Back Fusion PAIN:  Are you having pain? Yes: NPRS scale: 2.5/10 Pain location: medial left knee Pain description: Sharp Aggravating factors: walking, squatting Relieving factors: NSAIDs  PRECAUTIONS: None  RED FLAGS: None   WEIGHT BEARING RESTRICTIONS: No  FALLS:  Has patient fallen in last 6 months?  Yes but related to external factor   PATIENT GOALS: Pt wants to return back to dance.  NEXT MD VISIT: February  OBJECTIVE:  Note: Objective measures were completed at Evaluation unless otherwise noted.  DIAGNOSTIC FINDINGS:  Left  knee MRI, 04/07/2023   Impression: 1.  Acute versus chronic sprain or previous low-grade partial tear of the ACL 2.  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  Moderate knee joint effusion with small medial and suprapatellar plica and trace popliteal cyst 4.  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  Mild popliteus tendinosis    PATIENT SURVEYS:  LEFS 41/80 = 51.2%  COGNITION: Overall cognitive status: Within functional limits for tasks  assessed     SENSATION: WFL  POSTURE: increased lumbar lordosis  PALPATION: TTP along medial and lateral sides of patella  LOWER EXTREMITY ROM:  Active ROM Right eval Left eval  Hip flexion    Hip extension    Hip abduction    Hip adduction    Hip internal rotation    Hip external rotation    Knee flexion WFL 110  Knee extension 0 0  Ankle dorsiflexion    Ankle plantarflexion    Ankle inversion    Ankle eversion     (Blank rows = not tested)  LOWER EXTREMITY MMT:  MMT Right eval Left eval  Hip flexion 4+ 4-  Hip extension 4- 4-  Hip abduction 4- 4-  Hip adduction    Hip internal rotation    Hip external rotation    Knee flexion 4- 4-  Knee extension 5 4-  Ankle dorsiflexion 5 4+  Ankle plantarflexion    Ankle inversion    Ankle eversion     (Blank rows = not tested)    FUNCTIONAL TESTS:  30 seconds chair stand test: 12x with R lateral weight shift 2 minute walk test: 469ft  GAIT: Distance walked: 460ft Assistive device utilized: None Level of assistance: Complete Independence Comments: Wide base of support due to body habitus.  Stairs:  4x 7in stairs; Alternating acsending with single UE support. Descending- stepto leading RLE with BUE support.   TODAY'S TREATMENT:                                                                                                                              DATE:  05/15/2023  -Recumbent bike x5' seat 6 -Standing knee flexion 2 x 15 with 2lb ankle weight -LAQ 2 x 15 with 2lb ankle weight -Anterior tibial translated sit/stands with heavy theraband x 12 with cues hip hinging -TKE with RTB  05/02/2023 PT Evaluation    PATIENT EDUCATION:  Education details: PT Evaluation, findings, prognosis, frequency, attendance policy, and HEP if given.  Person educated: Patient Education method: Medical illustrator Education comprehension: verbalized understanding  HOME EXERCISE PROGRAM: Access Code:  [email or web address] URL: [email or web address] Date: 05/02/2023 Prepared by: Starling Manns  Exercises - Wall Quarter Squat  - 1 x daily - 7 x weekly - 3 sets - 10 reps - 20 hold - Standing Knee Flexion  - 1 x daily - 7 x weekly - 3 sets -  10 reps - Standing Hip Abduction with Counter Support  - 1 x daily - 7 x weekly - 3 sets - 10 reps - Step Up  - 1 x daily - 7 x weekly - 3 sets - 10 reps  05/15/2023 Access Code: [email or web address] URL: [email or web address] Date: 05/15/2023 Prepared by: Starling Manns  Exercises - Supine Active Straight Leg Raise  - 1 x daily - 7 x weekly - 3 sets - 10 reps - Supine Bridge  - 1 x daily - 7 x weekly - 3 sets - 10 reps - Standing Hamstring Curl with Resistance  - 1 x daily - 7 x weekly - 3 sets - 10 reps - Standing Terminal Knee Extension with Resistance  - 1 x daily - 7 x weekly - 3 sets - 10 reps - Hip Abduction with Resistance Loop  - 1 x daily - 7 x weekly - 3 sets - 10 reps  ASSESSMENT:  CLINICAL IMPRESSION: Pt tolerating session well. First treatment session since evaluation and overview of HEP. Continues to show mild R shift during functional transfers and standing activities to offload LLE due to pain. Tolerated changes with anterior tibial translation cues for arthrokinematic stability to isolate quadriceps strength. New HEP added. Pt will benefit from skilled Physical Therapy services to address deficits/limitations in order to improve functional and QOL.    OBJECTIVE IMPAIRMENTS: Abnormal gait, difficulty walking, decreased ROM, decreased strength, improper body mechanics, and postural dysfunction.   ACTIVITY LIMITATIONS: carrying, lifting, standing, squatting, stairs, transfers, and locomotion level  PARTICIPATION LIMITATIONS: driving, community activity, occupation, and yard work  PERSONAL FACTORS: Age are also affecting patient's functional outcome.   REHAB POTENTIAL: Good  CLINICAL DECISION MAKING:  Stable/uncomplicated  EVALUATION COMPLEXITY: Low   GOALS: Goals reviewed with patient? No  SHORT TERM GOALS: Target date: 05/23/2023  Pt will be independent with HEP in order to demonstrate participation in Physical Therapy POC.  Baseline: Goal status: INITIAL  2.  Pt will 1/10 pain during mobility in order to demonstrate improved pain with functional activities.  Baseline:  Goal status: INITIAL  LONG TERM GOALS: Target date: 06/13/2023  Pt will improve 30 second chair test by 2 in order to demonstrate improved functional strength to return to desired activities.  Baseline: See objective.  Goal status: INITIAL  2.  Pt will perform 4 symmetrical, alternating descending stair negotiation in order to demonstrate increased eccentric control of L quadriceps without zero reports of pain or instability. Baseline: See objective.  Goal status: INITIAL  3.  Pt will improve LEFS score by 10 points in order to demonstrate improved pain with functional goals and outcomes. Baseline: See objective.  Goal status: INITIAL  4.  Pt will improve LLE MMT by >1/2 grade in order to reduce pain during functional ambulatory activities. Baseline: See objective.  Goal status: INITIAL  5.  Pt will report ability to participate in at least one time of dancing/recreational activities in order to demonstrate reduced Left knee pain. Baseline: See objective.  Goal status: INITIAL   PLAN:  PT FREQUENCY: 1x/week  PT DURATION: 6 weeks  PLANNED INTERVENTIONS: 97164- PT Re-evaluation, 97110-Therapeutic exercises, 97530- Therapeutic activity, 97112- Neuromuscular re-education, 97535- Self Care, 40981- Manual therapy, and 97116- Gait training  PLAN FOR NEXT SESSION: quadriceps isometric training, hamstring strengthening, stair training, wants to get back to dancing.   Nelida Meuse PT, DPT [facility]- [facility] [phone] office   Nelida Meuse, PT 05/15/2023, 9:27  AM

## 2023-05-19 ENCOUNTER — Ambulatory Visit (INDEPENDENT_AMBULATORY_CARE_PROVIDER_SITE_OTHER): Payer: Commercial Managed Care - PPO | Admitting: *Deleted

## 2023-05-19 DIAGNOSIS — Z3042 Encounter for surveillance of injectable contraceptive: Secondary | ICD-10-CM | POA: Diagnosis not present

## 2023-05-19 MED ORDER — MEDROXYPROGESTERONE ACETATE 150 MG/ML IM SUSP
150.0000 mg | Freq: Once | INTRAMUSCULAR | Status: AC
  Administered 2023-05-19: 150 mg via INTRAMUSCULAR

## 2023-05-19 NOTE — Progress Notes (Signed)
   NURSE VISIT- INJECTION  SUBJECTIVE:  Jennifer Gonzales is a 43 y.o. [id number] female here for a Depo Provera  for contraception/period management. She is a GYN patient.   OBJECTIVE:  There were no vitals taken for this visit.  Appears well, in no apparent distress  Injection administered in: Left deltoid  No orders of the defined types were placed in this encounter.   ASSESSMENT: GYN patient Depo Provera  for contraception/period management PLAN: Follow-up: in 11-13 weeks for next Depo   Alan LITTIE Fischer  05/19/2023 10:10 AM

## 2023-05-25 ENCOUNTER — Ambulatory Visit: Payer: Commercial Managed Care - PPO | Admitting: Internal Medicine

## 2023-05-25 ENCOUNTER — Encounter (HOSPITAL_COMMUNITY): Payer: Commercial Managed Care - PPO

## 2023-05-29 ENCOUNTER — Encounter (HOSPITAL_COMMUNITY): Payer: Commercial Managed Care - PPO

## 2023-06-08 ENCOUNTER — Encounter (HOSPITAL_COMMUNITY): Payer: Self-pay

## 2023-06-08 ENCOUNTER — Encounter (HOSPITAL_COMMUNITY): Payer: Commercial Managed Care - PPO

## 2023-06-08 NOTE — Therapy (Signed)
[facility] Outpatient Rehabilitation at [facility] [address] [place], [place], [address] Phone: [phone]   Fax:  [phone]  Patient Details  Name: Jennifer Gonzales MRN: [id number] Date of Birth: 1980/06/04 Referring Provider:  No ref. provider found  Encounter Date: 06/08/2023  Called pt regarding no show #1. Pt reporting she is sick and could not make it. Pt was reminded of no show policy and to call if sick. Pt understood. Confirmed next appointment with pt.   Nelida Meuse, PT 06/08/2023, 12:11 PM  [facility] Outpatient Rehabilitation at [facility] [address] [place], [place], [address] Phone: [phone]   Fax:  [phone]

## 2023-06-12 ENCOUNTER — Ambulatory Visit (HOSPITAL_COMMUNITY): Payer: Commercial Managed Care - PPO | Attending: Orthopedic Surgery

## 2023-06-12 ENCOUNTER — Encounter (HOSPITAL_COMMUNITY): Payer: Self-pay

## 2023-06-12 DIAGNOSIS — M25562 Pain in left knee: Secondary | ICD-10-CM | POA: Diagnosis present

## 2023-06-12 DIAGNOSIS — M6281 Muscle weakness (generalized): Secondary | ICD-10-CM | POA: Insufficient documentation

## 2023-06-12 DIAGNOSIS — G8929 Other chronic pain: Secondary | ICD-10-CM | POA: Insufficient documentation

## 2023-06-12 NOTE — Therapy (Signed)
OUTPATIENT PHYSICAL THERAPY LOWER EXTREMITY EVALUATION   Patient Name: Jennifer Gonzales MRN: [id number] DOB:Nov 11, 1980, 43 y.o., female Today's Date: 06/12/2023  END OF SESSION:  PT End of Session - 06/12/23 1227     Visit Number 3    Number of Visits 7    Authorization Type [employer]    Authorization Time Period 30 visit total; 20 remaining, no auth    Authorization - Visit Number 3    Progress Note Due on Visit 7    PT Start Time 1151    PT Stop Time 1226    PT Time Calculation (min) 35 min    Activity Tolerance Patient tolerated treatment well    Behavior During Therapy WFL for tasks assessed/performed               Past Medical History:  Diagnosis Date   Back pain    BV (bacterial vaginosis)    Cervical high risk human papillomavirus (HPV) DNA test positive 12/05/2016   Fibromyalgia    Hx of migraines    Hypertension    Mental disorder    Papanicolaou smear of cervix with positive high risk human papilloma virus (HPV) test 04/02/2019   03/2019, +HPV 18 needs colpo___   Vaginal Pap smear, abnormal    Past Surgical History:  Procedure Laterality Date   BACK SURGERY     CESAREAN SECTION     CHOLECYSTECTOMY     TONSILLECTOMY     Patient Active Problem List   Diagnosis Date Noted   Encounter for screening fecal occult blood testing 07/16/2021   Hypertension 07/16/2021   Encounter for gynecological examination with Papanicolaou smear of cervix 04/24/2020   Encounter for surveillance of injectable contraceptive 04/24/2020   Weight gain 04/24/2020   Hot flashes 04/24/2020   Smoker 04/24/2020   Papanicolaou smear of cervix with positive high risk human papilloma virus (HPV) test 04/02/2019   Cervical high risk human papillomavirus (HPV) DNA test positive 12/05/2016   Hyperlipemia 04/05/2015   Esophageal reflux 04/05/2015   Obesity, unspecified 04/05/2015   Hx of migraines 07/28/2012   Depression 07/28/2012   Back pain 07/28/2012   Fibromyalgia 07/28/2012   BV  (bacterial vaginosis) 07/28/2012    PCP: Benetta Spar, MD  REFERRING PROVIDER: Thane Edu MD  REFERRING DIAG:  [phone] (ICD-10-CM) - Chronic pain of left knee  M25.362 (ICD-10-CM) - Instability of left knee joint    THERAPY DIAG:  Chronic pain of left knee  Muscle weakness (generalized)  Rationale for Evaluation and Treatment: Rehabilitation  ONSET DATE: ~ 5 years ago  SUBJECTIVE:   SUBJECTIVE STATEMENT: 3-4/10 pain on most days. Has noticed more pain on R knee now, but left knee is improving.   PERTINENT HISTORY: Back Fusion PAIN:  Are you having pain? Yes: NPRS scale: 2.5/10 Pain location: medial left knee Pain description: Sharp Aggravating factors: walking, squatting Relieving factors: NSAIDs  PRECAUTIONS: None  RED FLAGS: None   WEIGHT BEARING RESTRICTIONS: No  FALLS:  Has patient fallen in last 6 months?  Yes but related to external factor   PATIENT GOALS: Pt wants to return back to dance.  NEXT MD VISIT: February  OBJECTIVE:  Note: Objective measures were completed at Evaluation unless otherwise noted.  DIAGNOSTIC FINDINGS:  Left knee MRI, 04/07/2023   Impression: 1.  Acute versus chronic sprain or previous low-grade partial tear of the ACL 2.  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  Moderate knee joint effusion with  small medial and suprapatellar plica and trace popliteal cyst 4.  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  Mild popliteus tendinosis    PATIENT SURVEYS:  LEFS 41/80 = 51.2%  COGNITION: Overall cognitive status: Within functional limits for tasks assessed     SENSATION: WFL  POSTURE: increased lumbar lordosis  PALPATION: TTP along medial and lateral sides of patella  LOWER EXTREMITY ROM:  Active ROM Right eval Left eval Left 06/12/2023  Hip flexion     Hip extension     Hip  abduction     Hip adduction     Hip internal rotation     Hip external rotation     Knee flexion WFL 110 120  Knee extension 0 0 0  Ankle dorsiflexion     Ankle plantarflexion     Ankle inversion     Ankle eversion      (Blank rows = not tested)  LOWER EXTREMITY MMT:  MMT Right eval Left eval Right 06/12/2023 Left 06/12/2023  Hip flexion 4+ 4- 4 4  Hip extension 4- 4- 4- 4-  Hip abduction 4- 4- 4+ 4  Hip adduction      Hip internal rotation      Hip external rotation      Knee flexion 4- 4- 4- 4-  Knee extension 5 4- 4 4+  Ankle dorsiflexion 5 4+    Ankle plantarflexion      Ankle inversion      Ankle eversion       (Blank rows = not tested)    FUNCTIONAL TESTS:  30 seconds chair stand test: 12x with R lateral weight shift 2 minute walk test: 411ft  GAIT: Distance walked: 416ft Assistive device utilized: None Level of assistance: Complete Independence Comments: Wide base of support due to body habitus.  Stairs:  4x 7in stairs; Alternating acsending with single UE support. Descending- stepto leading RLE with BUE support.   TODAY'S TREATMENT:                                                                                                                              DATE:  06/12/2023  -ROM -MMT -30 Second Chair Stand test: 14x -LEFS: 58/80 = 72.5% -4x stairs with ascending and descending via reciprocal pattern with intermittent UE support.  -Leg press 1 set to fatigue bilaterally with #5 plate   91/47/8295  -Recumbent bike x5' seat 6 -Standing knee flexion 2 x 15 with 2lb ankle weight -LAQ 2 x 15 with 2lb ankle weight -Anterior tibial translated sit/stands with heavy theraband x 12 with cues hip hinging -TKE with RTB  05/02/2023 PT Evaluation    PATIENT EDUCATION:  Education details: PT Evaluation, findings, prognosis, frequency, attendance policy, and HEP if given.  Person educated: Patient Education method: Medical illustrator Education  comprehension: verbalized understanding  HOME EXERCISE PROGRAM: Access Code: [email or web address] URL: [email or web address] Date: 05/02/2023 Prepared by: Enid Derry  Corderro Koloski  Exercises - Wall Quarter Squat  - 1 x daily - 7 x weekly - 3 sets - 10 reps - 20 hold - Standing Knee Flexion  - 1 x daily - 7 x weekly - 3 sets - 10 reps - Standing Hip Abduction with Counter Support  - 1 x daily - 7 x weekly - 3 sets - 10 reps - Step Up  - 1 x daily - 7 x weekly - 3 sets - 10 reps  05/15/2023 Access Code: [email or web address] URL: [email or web address] Date: 05/15/2023 Prepared by: Starling Manns  Exercises - Supine Active Straight Leg Raise  - 1 x daily - 7 x weekly - 3 sets - 10 reps - Supine Bridge  - 1 x daily - 7 x weekly - 3 sets - 10 reps - Standing Hamstring Curl with Resistance  - 1 x daily - 7 x weekly - 3 sets - 10 reps - Standing Terminal Knee Extension with Resistance  - 1 x daily - 7 x weekly - 3 sets - 10 reps - Hip Abduction with Resistance Loop  - 1 x daily - 7 x weekly - 3 sets - 10 reps  ASSESSMENT:  CLINICAL IMPRESSION: Pt tolerating re-evaluation well. Showing great progress in reduced limitations, increased pain response and improved control with stair negotiation. Pt continues with MMT weakness, which continues to limited recreational desires, running which is party of job duties, and continues with pain which limit full functional participate. Pt would benefit from extension of POC by 4 more visits in 4 weeks to maximize strength gains as this is limiting factor in full return of dynamic job duties. Pt will benefit from skilled Physical Therapy services to address deficits/limitations in order to improve functional and QOL.    OBJECTIVE IMPAIRMENTS: Abnormal gait, difficulty walking, decreased ROM, decreased strength, improper body mechanics, and postural dysfunction.   ACTIVITY LIMITATIONS: carrying, lifting, standing, squatting, stairs, transfers, and locomotion  level  PARTICIPATION LIMITATIONS: driving, community activity, occupation, and yard work  PERSONAL FACTORS: Age are also affecting patient's functional outcome.   REHAB POTENTIAL: Good  CLINICAL DECISION MAKING: Stable/uncomplicated  EVALUATION COMPLEXITY: Low   GOALS: Goals reviewed with patient? No  SHORT TERM GOALS: Target date: 05/23/2023  Pt will be independent with HEP in order to demonstrate participation in Physical Therapy POC.  Baseline: Goal status: INITIAL  2.  Pt will 1/10 pain during mobility in order to demonstrate improved pain with functional activities.  Baseline:  Goal status: IN PROGRESS  LONG TERM GOALS: Target date: 06/13/2023;   Pt will improve 30 second chair test by 2 in order to demonstrate improved functional strength to return to desired activities.  Baseline: See objective.  Goal status: MET  2.  Pt will perform 4 symmetrical, alternating descending stair negotiation in order to demonstrate increased eccentric control of L quadriceps without zero reports of pain or instability. Baseline: See objective.  Goal status: MET  3.  Pt will improve LEFS score by 10 points in order to demonstrate improved pain with functional goals and outcomes. Baseline: See objective.  Goal status: MET  4.  Pt will improve LLE MMT by >1/2 grade in order to reduce pain during functional ambulatory activities. Baseline: See objective.  Goal status: IN PROGRESS  5.  Pt will report ability to participate in at least one time of dancing/recreational activities in order to demonstrate reduced Left knee pain. Baseline: See objective.  Goal status: IN PROGRESS   PLAN:  PT FREQUENCY: 1x/week  PT DURATION: 4 weeks  PLANNED INTERVENTIONS: 97164- PT Re-evaluation, 97110-Therapeutic exercises, 97530- Therapeutic activity, [id number]- Neuromuscular re-education, 97535- Self Care, 16109- Manual therapy, [id number]- Gait training, 97014- Electrical stimulation (unattended), [id number]  Electrical stimulation (manual), [id number]- Traction (mechanical), Patient/Family education, Balance training, and Stair training  PLAN FOR NEXT SESSION: quadriceps isometric training, hamstring strengthening, stair training, wants to get back to dancing.   Nelida Meuse PT, DPT [facility]- [facility] [phone] office   Nelida Meuse, PT 06/12/2023, 12:28 PM

## 2023-06-21 ENCOUNTER — Encounter: Payer: Self-pay | Admitting: Orthopedic Surgery

## 2023-06-21 ENCOUNTER — Ambulatory Visit (INDEPENDENT_AMBULATORY_CARE_PROVIDER_SITE_OTHER): Payer: Commercial Managed Care - PPO | Admitting: Orthopedic Surgery

## 2023-06-21 VITALS — BP 156/103 | HR 91 | Ht 59.5 in | Wt 273.0 lb

## 2023-06-21 DIAGNOSIS — G8929 Other chronic pain: Secondary | ICD-10-CM

## 2023-06-21 DIAGNOSIS — M25562 Pain in left knee: Secondary | ICD-10-CM

## 2023-06-21 NOTE — Progress Notes (Signed)
 Return patient Visit  Assessment: Jennifer Gonzales is a 43 y.o. female with the following: 1. Chronic pain of left knee  Plan: Jennifer Gonzales has chronic pain in her left knee.  Physical therapy has helped.  She suffered a minor setback couple of days ago, with increasing pain.  It is better today than it was yesterday.  I have encouraged her to continue working with physical therapy.  Continue to remain active.  Ibuprofen  as needed.  Ice her knees as needed.  If the right knee continues to bother her, or gets worse, we can complete a full workup.  Otherwise, follow-up as needed.    Follow-up: Return if symptoms worsen or fail to improve.  Subjective:  Chief Complaint  Patient presents with   Knee Pain    L knee was good until Monday when she had to run for a medical emergency. Has been having some R knee pain     History of Present Illness: Jennifer Gonzales is a 43 y.o. female who returns to clinic today for repeat evaluation of left knee pain.  I have seen her several times for her left knee.  At the last visit, we injected the left knee.  She noted some improvements in her symptoms, but these were not sustained.  She has been working with physical therapy for the past month.  She has been going to therapy once a week.  She reports that she had an illness, and did miss some appointments.  She has noticed the right knee is starting to bother her more.  She was doing a lot better until 2 days ago, when she ran for a medical emergency at work.  She started to have pain in the left knee.  Her pain is since improved.  She had some swelling after the incident on Monday.   Review of Systems: No fevers or chills No numbness or tingling No chest pain No shortness of breath No bowel or bladder dysfunction No GI distress No headaches      Objective: BP (!) 156/103   Pulse 91   Ht 4' 11.5 (1.511 m)   Wt 273 lb (123.8 kg)   BMI 54.22 kg/m   Physical Exam:  General: Alert and  oriented. and No acute distress. Gait: Left sided antalgic gait.  Left knee without swelling.  No bruising.  No redness.  She is able to achieve full extension.  She is tenderness to palpation along the medial joint line.  She has pain with flexion beyond 90 degrees.  Negative Lachman.  Crepitus with range of motion testing.  Her range of motion and crepitus in the right knee.  IMAGING: I personally reviewed images previously obtained in clinic  Left knee MRI, 04/07/2023  Impression: 1.  Acute versus chronic sprain or previous low-grade partial tear of the ACL 2.  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  Moderate knee joint effusion with small medial and suprapatellar plica and trace popliteal cyst 4.  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  Mild popliteus tendinosis  New Medications:  No orders of the defined types were placed in this encounter.     Oneil DELENA Horde, MD  06/21/2023 9:46 AM

## 2023-06-22 ENCOUNTER — Encounter (HOSPITAL_COMMUNITY): Payer: Commercial Managed Care - PPO

## 2023-06-30 ENCOUNTER — Ambulatory Visit (INDEPENDENT_AMBULATORY_CARE_PROVIDER_SITE_OTHER): Payer: Commercial Managed Care - PPO | Admitting: Internal Medicine

## 2023-06-30 ENCOUNTER — Encounter: Payer: Self-pay | Admitting: Internal Medicine

## 2023-06-30 ENCOUNTER — Encounter (HOSPITAL_COMMUNITY): Payer: Self-pay

## 2023-06-30 ENCOUNTER — Ambulatory Visit (HOSPITAL_COMMUNITY): Payer: Commercial Managed Care - PPO | Attending: Orthopedic Surgery

## 2023-06-30 VITALS — BP 142/88 | HR 97 | Ht 59.5 in | Wt 274.2 lb

## 2023-06-30 DIAGNOSIS — G43E19 Chronic migraine with aura, intractable, without status migrainosus: Secondary | ICD-10-CM | POA: Diagnosis not present

## 2023-06-30 DIAGNOSIS — G8929 Other chronic pain: Secondary | ICD-10-CM | POA: Insufficient documentation

## 2023-06-30 DIAGNOSIS — M25562 Pain in left knee: Secondary | ICD-10-CM | POA: Insufficient documentation

## 2023-06-30 DIAGNOSIS — M797 Fibromyalgia: Secondary | ICD-10-CM

## 2023-06-30 DIAGNOSIS — M6281 Muscle weakness (generalized): Secondary | ICD-10-CM | POA: Diagnosis present

## 2023-06-30 DIAGNOSIS — I10 Essential (primary) hypertension: Secondary | ICD-10-CM | POA: Diagnosis not present

## 2023-06-30 DIAGNOSIS — Z114 Encounter for screening for human immunodeficiency virus [HIV]: Secondary | ICD-10-CM

## 2023-06-30 DIAGNOSIS — K529 Noninfective gastroenteritis and colitis, unspecified: Secondary | ICD-10-CM | POA: Insufficient documentation

## 2023-06-30 DIAGNOSIS — K219 Gastro-esophageal reflux disease without esophagitis: Secondary | ICD-10-CM

## 2023-06-30 DIAGNOSIS — E782 Mixed hyperlipidemia: Secondary | ICD-10-CM

## 2023-06-30 DIAGNOSIS — Z1159 Encounter for screening for other viral diseases: Secondary | ICD-10-CM

## 2023-06-30 DIAGNOSIS — M25561 Pain in right knee: Secondary | ICD-10-CM

## 2023-06-30 DIAGNOSIS — Z720 Tobacco use: Secondary | ICD-10-CM

## 2023-06-30 DIAGNOSIS — R739 Hyperglycemia, unspecified: Secondary | ICD-10-CM

## 2023-06-30 DIAGNOSIS — M25362 Other instability, left knee: Secondary | ICD-10-CM | POA: Diagnosis present

## 2023-06-30 DIAGNOSIS — Z2821 Immunization not carried out because of patient refusal: Secondary | ICD-10-CM

## 2023-06-30 MED ORDER — TOPIRAMATE 50 MG PO TABS: 50.0000 mg | ORAL_TABLET | Freq: Every day | ORAL | 5 refills | Status: DC

## 2023-06-30 MED ORDER — RIZATRIPTAN BENZOATE 10 MG PO TABS: 10.0000 mg | ORAL_TABLET | ORAL | 2 refills | Status: AC | PRN

## 2023-06-30 MED ORDER — LOSARTAN POTASSIUM 25 MG PO TABS: 25.0000 mg | ORAL_TABLET | Freq: Every day | ORAL | 0 refills | Status: DC

## 2023-06-30 MED ORDER — LANSOPRAZOLE 30 MG PO CPDR: 30.0000 mg | DELAYED_RELEASE_CAPSULE | Freq: Every day | ORAL | 1 refills | Status: DC

## 2023-06-30 NOTE — Assessment & Plan Note (Signed)
Previous x-ray of left knee showed degenerative changes She reports that she had an MRI of knee as well, which showed ligament injury and bursitis Takes ibuprofen as needed for pain Followed by orthopedic surgery-has had intra articular steroid injection, currently getting PT

## 2023-06-30 NOTE — Progress Notes (Addendum)
 New Patient Office Visit  Subjective:  Patient ID: Jennifer Gonzales, female    DOB: 10/19/1980  Age: 43 y.o. MRN: [id number]  CC:  Chief Complaint  Patient presents with  . Establish Care    New patient , needs to establish care. Pt reports knee pain and back pain.     HPI Jennifer Gonzales is a 43 y.o. female with past medical history of HTN, migraine and morbid obesity who presents for establishing care.  HTN: Her BP was elevated today.  She was placed on losartan  50 mg QD by her previous PCP, but she did not continue taking it, as it was causing headache and dizziness.  She attributes her elevated blood pressure to chronic knee pain and fibromyalgia.  Denies any chest pain, dyspnea or palpitations currently.  Fibromyalgia: She reports history of fibromyalgia and had responded well to topiramate  50 mg QD.  History of migraine: She has longstanding history of migraine, you usually gets about 3 episodes in a month with nausea, photophobia, phonophobia.  She used to take topiramate , which had helped her with frequency and intensity of migraine.  She usually goes in a dark room and puts ice bags around her head to get relief.  Chronic left knee pain: Has been evaluated by orthopedic surgeon. She has been working with physical therapy for the past month. She has been going to therapy once a week. She has noticed the right knee is starting to bother her more recently as she has been putting more pressure on right knee to avoid left-sided knee pain. She was doing a lot better until 06/19/23, when she ran for a medical emergency at work. She started to have pain in the left knee. Her pain is since improved.  Chronic diarrhea: She reports chronic diarrhea, usually loose BM.  She has tried taking fiber supplement and Imodium as needed for diarrhea.  She reports history of lactose intolerance, and does not take dairy products usually.  Denies any melena or hematochezia.  She has abdominal cramping at  times, which resolves after defecation.  She has intermittent constipation as well with certain food.  Past Medical History:  Diagnosis Date  . Back pain   . BV (bacterial vaginosis)   . 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 Fibromyalgia   . Hx of migraines   . Hypertension   . Mental disorder   . Papanicolaou smear of cervix with positive high risk human papilloma virus (HPV) test 04/02/2019   03/2019, +HPV 18 needs colpo___  . Vaginal Pap smear, abnormal     Past Surgical History:  Procedure Laterality Date  . BACK SURGERY    . CESAREAN SECTION    . CHOLECYSTECTOMY    . TONSILLECTOMY      Family History  Problem Relation Age of Onset  . Hypertension Other   . Diabetes Other   . Stroke Other   . Heart disease Other   . Cancer Paternal Grandmother        breast  . Diabetes Father   . Muscular dystrophy Mother   . Muscular dystrophy Brother   . Muscular dystrophy Sister   . Muscular dystrophy Sister   . Asthma Son   . Other Son        allergies    Social History   Socioeconomic History  . Marital status: Divorced    Spouse name: Not on file  . Number of children: 2  . Years  of education: Not on file  . Highest education level: Not on file  Occupational History  . Not on file  Tobacco Use  . Smoking status: Every Day    Current packs/day: 0.25    Average packs/day: 0.3 packs/day for 23.0 years (5.8 ttl pk-yrs)    Types: Cigarettes  . Smokeless tobacco: Never  Vaping Use  . Vaping status: Former  Substance and Sexual Activity  . Alcohol use: Not Currently  . Drug use: No  . Sexual activity: Not Currently    Birth control/protection: Injection  Other Topics Concern  . Not on file  Social History Narrative  . Not on file   Social Drivers of Health   Financial Resource Strain: Low Risk  (08/11/2023)   Overall Financial Resource Strain (CARDIA)   . Difficulty of Paying Living Expenses: Not hard at all  Food  Insecurity: No Food Insecurity (08/11/2023)   Hunger Vital Sign   . Worried About Programme researcher, broadcasting/film/video in the Last Year: Never true   . Ran Out of Food in the Last Year: Never true  Transportation Needs: No Transportation Needs (08/11/2023)   PRAPARE - Transportation   . Lack of Transportation (Medical): No   . Lack of Transportation (Non-Medical): No  Physical Activity: Insufficiently Active (08/11/2023)   Exercise Vital Sign   . Days of Exercise per Week: 2 days   . Minutes of Exercise per Session: 20 min  Stress: Stress Concern Present (08/11/2023)   [employer] of Occupational Health - Occupational Stress Questionnaire   . Feeling of Stress : To some extent  Social Connections: Socially Isolated (08/11/2023)   Social Connection and Isolation Panel [NHANES]   . Frequency of Communication with Friends and Family: More than three times a week   . Frequency of Social Gatherings with Friends and Family: More than three times a week   . Attends Religious Services: Never   . Active Member of Clubs or Organizations: No   . Attends Banker Meetings: Never   . Marital Status: Divorced  Catering manager Violence: Not At Risk (08/11/2023)   Humiliation, Afraid, Rape, and Kick questionnaire   . Fear of Current or Ex-Partner: No   . Emotionally Abused: No   . Physically Abused: No   . Sexually Abused: No    ROS Review of Systems  Constitutional:  Negative for chills and fever.  HENT:  Negative for congestion, sinus pressure, sinus pain and sore throat.   Eyes:  Negative for pain and discharge.  Respiratory:  Negative for cough and shortness of breath.   Cardiovascular:  Negative for chest pain and palpitations.  Gastrointestinal:  Positive for diarrhea. Negative for abdominal pain, nausea and vomiting.  Endocrine: Negative for polydipsia and polyuria.  Genitourinary:  Negative for dysuria and hematuria.  Musculoskeletal:  Positive for arthralgias (B/l knee) and myalgias.  Negative for neck pain and neck stiffness.  Skin:  Negative for rash.  Neurological:  Negative for dizziness and weakness.  Psychiatric/Behavioral:  Positive for sleep disturbance. Negative for agitation and behavioral problems.     Objective:   Today's Vitals: BP (!) 142/88 (BP Location: Left Arm)   Pulse 97   Ht 4' 11.5" (1.511 m)   Wt 274 lb 3.2 oz (124.4 kg)   SpO2 92%   BMI 54.45 kg/m   Physical Exam Vitals reviewed.  Constitutional:      General: She is not in acute distress.    Appearance: She is obese. She is  not diaphoretic.  HENT:     Head: Normocephalic and atraumatic.     Nose: Nose normal.     Mouth/Throat:     Mouth: Mucous membranes are moist.  Eyes:     General: No scleral icterus.    Extraocular Movements: Extraocular movements intact.  Cardiovascular:     Rate and Rhythm: Normal rate and regular rhythm.     Heart sounds: Normal heart sounds. No murmur heard. Pulmonary:     Breath sounds: Normal breath sounds. No wheezing or rales.  Musculoskeletal:     Cervical back: Neck supple. No tenderness.     Right lower leg: No edema.     Left lower leg: No edema.  Skin:    General: Skin is warm.     Findings: No rash.  Neurological:     General: No focal deficit present.     Mental Status: She is alert and oriented to person, place, and time.  Psychiatric:        Mood and Affect: Mood normal.        Behavior: Behavior normal.    Assessment & Plan:   Problem List Items Addressed This Visit       Cardiovascular and Mediastinum   Migraine   Uncontrolled Restart topiramate  at 25 mg once daily for 1 week and then 50 mg once daily Maxalt  as needed for breakthrough migraine, has tried Imitrex in the past without much relief      Relevant Medications   topiramate  (TOPAMAX ) 50 MG tablet   rizatriptan  (MAXALT ) 10 MG tablet   Essential hypertension - Primary   BP Readings from Last 1 Encounters:  06/30/23 (!) 140/82   Uncontrolled, although knee pain  and fibromyalgia can contribute to elevated blood pressure, but considering her chronically elevated blood pressure - started losartan  25 mg once daily, check CMP after 2 weeks Counseled for compliance with the medications Advised DASH diet and moderate exercise/walking, at least 150 mins/week       Relevant Orders   CMP14+EGFR   TSH + free T4     Digestive   GERD (gastroesophageal reflux disease)   Well controlled with Prevacid  30 mg QD, sent as prescription      Relevant Medications   lansoprazole  (PREVACID ) 30 MG capsule   Chronic diarrhea   Has chronic diarrhea, reports lactose intolerance -avoids dairy products Intermittent constipation suggests IBS Continue fiber supplement and as needed Imodium for now Referred to GI      Relevant Orders   Ambulatory referral to Gastroenterology     Other   Fibromyalgia (Chronic)   Has chronic myalgias, reports history of fibromyalgia Was responding well to topiramate , restart topiramate  50 mg QD      Relevant Medications   topiramate  (TOPAMAX ) 50 MG tablet   Hyperlipemia   Check lipid profile Advised to follow DASH diet, material provided      Relevant Orders   Lipid Profile   Morbid obesity (HCC)   BMI Readings from Last 3 Encounters:  06/30/23 54.45 kg/m  06/21/23 54.22 kg/m  03/15/23 54.61 kg/m   Advised to follow DASH diet and perform moderate exercise/walking at least 150 minutes/week      Tobacco abuse   Smokes about 0.25 pack/day  Asked about quitting: confirms that he/she currently smokes cigarettes Advise to quit smoking: Educated about QUITTING to reduce the risk of cancer, cardio and cerebrovascular disease. Assess willingness: Unwilling to quit at this time, but is working on cutting back. Assist with counseling  and pharmacotherapy: Counseled for 5 minutes. Arrange for follow up: follow up in 3 months and continue to offer help.      Chronic pain of both knees   Previous x-ray of left knee showed  degenerative changes She reports that she had an MRI of knee as well, which showed ligament injury and bursitis Takes ibuprofen  as needed for pain Followed by orthopedic surgery-has had intra articular steroid injection, currently getting PT      Relevant Medications   topiramate  (TOPAMAX ) 50 MG tablet   Other Visit Diagnoses       Need for hepatitis C screening test       Relevant Orders   Hepatitis C Antibody     Encounter for screening for HIV       Relevant Orders   HIV antibody (with reflex)     Hyperglycemia       Relevant Orders   Hemoglobin A1c     Refused influenza vaccine           Outpatient Encounter Medications as of 06/30/2023  Medication Sig  . rizatriptan  (MAXALT ) 10 MG tablet Take 1 tablet (10 mg total) by mouth as needed for migraine. May repeat in 2 hours if needed  . topiramate  (TOPAMAX ) 50 MG tablet Take 1 tablet (50 mg total) by mouth daily.  . [DISCONTINUED] albuterol  (VENTOLIN  HFA) 108 (90 Base) MCG/ACT inhaler Inhale 2 puffs into the lungs every 6 (six) hours as needed for wheezing or shortness of breath.  . [DISCONTINUED] losartan  (COZAAR ) 25 MG tablet Take 1 tablet (25 mg total) by mouth daily.  . [DISCONTINUED] topiramate  (TOPAMAX ) 25 MG tablet Take 25 mg by mouth at bedtime.   . lansoprazole  (PREVACID ) 30 MG capsule Take 1 capsule (30 mg total) by mouth daily at 12 noon.  . [DISCONTINUED] busPIRone (BUSPAR) 15 MG tablet Take 10 mg by mouth.  . [DISCONTINUED] cetirizine (ZYRTEC) 10 MG tablet Take 10 mg by mouth daily as needed for allergies.  . [DISCONTINUED] ibuprofen  (ADVIL ) 200 MG tablet Take 600-800 mg by mouth as needed.  . [DISCONTINUED] lansoprazole  (PREVACID ) 30 MG capsule Take 30 mg by mouth daily at 12 noon.  . [DISCONTINUED] medroxyPROGESTERone  (DEPO-PROVERA ) 150 MG/ML injection BRING TO PRESCRIBER FOR INJECTION ONCE EVERY THREE MONTHS   No facility-administered encounter medications on file as of 06/30/2023.    Follow-up: Return in about  3 months (around 09/27/2023) for HTN and migraine.   Meldon Sport, MD

## 2023-06-30 NOTE — Assessment & Plan Note (Addendum)
BP Readings from Last 1 Encounters:  06/30/23 (!) 140/82   Uncontrolled, although knee pain and fibromyalgia can contribute to elevated blood pressure, but considering her chronically elevated blood pressure - started losartan 25 mg once daily, check CMP after 2 weeks Counseled for compliance with the medications Advised DASH diet and moderate exercise/walking, at least 150 mins/week

## 2023-06-30 NOTE — Assessment & Plan Note (Signed)
Smokes about 0.25 pack/day  Asked about quitting: confirms that he/she currently smokes cigarettes Advise to quit smoking: Educated about QUITTING to reduce the risk of cancer, cardio and cerebrovascular disease. Assess willingness: Unwilling to quit at this time, but is working on cutting back. Assist with counseling and pharmacotherapy: Counseled for 5 minutes. Arrange for follow up: follow up in 3 months and continue to offer help.

## 2023-06-30 NOTE — Assessment & Plan Note (Signed)
Uncontrolled Restart topiramate at 25 mg once daily for 1 week and then 50 mg once daily Maxalt as needed for breakthrough migraine, has tried Imitrex in the past without much relief

## 2023-06-30 NOTE — Assessment & Plan Note (Signed)
BMI Readings from Last 3 Encounters:  06/30/23 54.45 kg/m  06/21/23 54.22 kg/m  03/15/23 54.61 kg/m   Advised to follow DASH diet and perform moderate exercise/walking at least 150 minutes/week

## 2023-06-30 NOTE — Patient Instructions (Signed)
Please start taking Topiramate half tablet once daily for 1 week and then 1 tablet once daily. Please take Maxalt as needed for migraine flare, maximum 2 tablets in a day.  Please start taking Losartan 25 mg once daily.  Please follow DASH diet and perform moderate exercise/walking at least 150 mins/week.

## 2023-06-30 NOTE — Therapy (Signed)
OUTPATIENT PHYSICAL THERAPY LOWER EXTREMITY EVALUATION   Patient Name: Jennifer Gonzales MRN: [id number] DOB:1980-07-26, 43 y.o., female Today's Date: 06/30/2023  END OF SESSION:  PT End of Session - 06/30/23 1227     Visit Number 4    Number of Visits 7    Date for PT Re-Evaluation 07/20/23    Authorization Type [employer]    Authorization Time Period 30 visit total; 20 remaining, no auth    Authorization - Visit Number 4    Authorization - Number of Visits 20    Progress Note Due on Visit 7    PT Start Time 1148    PT Stop Time 1227    PT Time Calculation (min) 39 min    Activity Tolerance Patient tolerated treatment well    Behavior During Therapy WFL for tasks assessed/performed                Past Medical History:  Diagnosis Date   Back pain    BV (bacterial vaginosis)    Cervical high risk human papillomavirus (HPV) DNA test positive 12/05/2016   Fibromyalgia    Hx of migraines    Hypertension    Mental disorder    Papanicolaou smear of cervix with positive high risk human papilloma virus (HPV) test 04/02/2019   03/2019, +HPV 18 needs colpo___   Vaginal Pap smear, abnormal    Past Surgical History:  Procedure Laterality Date   BACK SURGERY     CESAREAN SECTION     CHOLECYSTECTOMY     TONSILLECTOMY     Patient Active Problem List   Diagnosis Date Noted   Chronic pain of both knees 06/30/2023   Chronic diarrhea 06/30/2023   Essential hypertension 07/16/2021   Encounter for surveillance of injectable contraceptive 04/24/2020   Hot flashes 04/24/2020   Tobacco abuse 04/24/2020   Papanicolaou smear of cervix with positive high risk human papilloma virus (HPV) test 04/02/2019   Hyperlipemia 04/05/2015   GERD (gastroesophageal reflux disease) 04/05/2015   Morbid obesity (HCC) 04/05/2015   Migraine 07/28/2012   Depression 07/28/2012   Back pain 07/28/2012   Fibromyalgia 07/28/2012    PCP: Benetta Spar, MD  REFERRING PROVIDER: Thane Edu  MD  REFERRING DIAG:  [phone] (ICD-10-CM) - Chronic pain of left knee  M25.362 (ICD-10-CM) - Instability of left knee joint    THERAPY DIAG:  Chronic pain of left knee  Muscle weakness (generalized)  Instability of left knee joint  Rationale for Evaluation and Treatment: Rehabilitation  ONSET DATE: ~ 5 years ago  SUBJECTIVE:   SUBJECTIVE STATEMENT: 7/10 pain today. Pt reporting incident at work where pt had to run and this flared up her knee really bad. Hurting since then.   PERTINENT HISTORY: Back Fusion PAIN:  Are you having pain? Yes: NPRS scale: 2.5/10 Pain location: medial left knee Pain description: Sharp Aggravating factors: walking, squatting Relieving factors: NSAIDs  PRECAUTIONS: None  RED FLAGS: None   WEIGHT BEARING RESTRICTIONS: No  FALLS:  Has patient fallen in last 6 months?  Yes but related to external factor   PATIENT GOALS: Pt wants to return back to dance.  NEXT MD VISIT: February  OBJECTIVE:  Note: Objective measures were completed at Evaluation unless otherwise noted.  DIAGNOSTIC FINDINGS:  Left knee MRI, 04/07/2023   Impression: 1.  Acute versus chronic sprain or previous low-grade partial tear of the ACL 2.  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  Moderate knee joint effusion  with small medial and suprapatellar plica and trace popliteal cyst 4.  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  Mild popliteus tendinosis    PATIENT SURVEYS:  LEFS 41/80 = 51.2%  COGNITION: Overall cognitive status: Within functional limits for tasks assessed     SENSATION: WFL  POSTURE: increased lumbar lordosis  PALPATION: TTP along medial and lateral sides of patella  LOWER EXTREMITY ROM:  Active ROM Right eval Left eval Left 06/12/2023  Hip flexion     Hip extension     Hip abduction     Hip adduction     Hip internal  rotation     Hip external rotation     Knee flexion WFL 110 120  Knee extension 0 0 0  Ankle dorsiflexion     Ankle plantarflexion     Ankle inversion     Ankle eversion      (Blank rows = not tested)  LOWER EXTREMITY MMT:  MMT Right eval Left eval Right 06/12/2023 Left 06/12/2023  Hip flexion 4+ 4- 4 4  Hip extension 4- 4- 4- 4-  Hip abduction 4- 4- 4+ 4  Hip adduction      Hip internal rotation      Hip external rotation      Knee flexion 4- 4- 4- 4-  Knee extension 5 4- 4 4+  Ankle dorsiflexion 5 4+    Ankle plantarflexion      Ankle inversion      Ankle eversion       (Blank rows = not tested)    FUNCTIONAL TESTS:  30 seconds chair stand test: 12x with R lateral weight shift 2 minute walk test: 473ft  GAIT: Distance walked: 419ft Assistive device utilized: None Level of assistance: Complete Independence Comments: Wide base of support due to body habitus.  Stairs:  4x 7in stairs; Alternating acsending with single UE support. Descending- stepto leading RLE with BUE support.   TODAY'S TREATMENT:                                                                                                                              DATE:  06/30/2023  -1.5'x2 elliptical -Sled pulls multiple rounds for 2'' -Standing knee flexion stretch on 12in box with quadriceps isometric and then knee extension with ankle dorsiflexion 10x 10' for multiple rounds  -Rest breaks as needed due to fatigue -Education regarding reaching out to PCP and Orthopedic for anti-inflammatory options to assist with pain management  06/12/2023  -ROM -MMT -30 Second Chair Stand test: 14x -LEFS: 58/80 = 72.5% -4x stairs with ascending and descending via reciprocal pattern with intermittent UE support.  -Leg press 1 set to fatigue bilaterally with #5 plate   62/95/2841  -Recumbent bike x5' seat 6 -Standing knee flexion 2 x 15 with 2lb ankle weight -LAQ 2 x 15 with 2lb ankle weight -Anterior tibial  translated sit/stands with heavy theraband x  12 with cues hip hinging -TKE with RTB  05/02/2023 PT Evaluation    PATIENT EDUCATION:  Education details: PT Evaluation, findings, prognosis, frequency, attendance policy, and HEP if given.  Person educated: Patient Education method: Medical illustrator Education comprehension: verbalized understanding  HOME EXERCISE PROGRAM: Access Code: [email or web address] URL: [email or web address] Date: 05/02/2023 Prepared by: Starling Manns  Exercises - Wall Quarter Squat  - 1 x daily - 7 x weekly - 3 sets - 10 reps - 20 hold - Standing Knee Flexion  - 1 x daily - 7 x weekly - 3 sets - 10 reps - Standing Hip Abduction with Counter Support  - 1 x daily - 7 x weekly - 3 sets - 10 reps - Step Up  - 1 x daily - 7 x weekly - 3 sets - 10 reps  05/15/2023 Access Code: [email or web address] URL: [email or web address] Date: 05/15/2023 Prepared by: Starling Manns  Exercises - Supine Active Straight Leg Raise  - 1 x daily - 7 x weekly - 3 sets - 10 reps - Supine Bridge  - 1 x daily - 7 x weekly - 3 sets - 10 reps - Standing Hamstring Curl with Resistance  - 1 x daily - 7 x weekly - 3 sets - 10 reps - Standing Terminal Knee Extension with Resistance  - 1 x daily - 7 x weekly - 3 sets - 10 reps - Hip Abduction with Resistance Loop  - 1 x daily - 7 x weekly - 3 sets - 10 reps  ASSESSMENT:  CLINICAL IMPRESSION: Pt tolerating session well today. Pain at beginning of sesssion was 7/10, after heavy loading with isometric like activity pt reporting same number at EOS for pain. Pt was educated ways to pursue anti-inflammatory effects with consultation from medical doctors. Educated to start slowly back into HEP. Pt will benefit from skilled Physical Therapy services to address deficits/limitations in order to improve functional and QOL.    OBJECTIVE IMPAIRMENTS: Abnormal gait, difficulty walking, decreased ROM, decreased strength, improper body  mechanics, and postural dysfunction.   ACTIVITY LIMITATIONS: carrying, lifting, standing, squatting, stairs, transfers, and locomotion level  PARTICIPATION LIMITATIONS: driving, community activity, occupation, and yard work  PERSONAL FACTORS: Age are also affecting patient's functional outcome.   REHAB POTENTIAL: Good  CLINICAL DECISION MAKING: Stable/uncomplicated  EVALUATION COMPLEXITY: Low   GOALS: Goals reviewed with patient? No  SHORT TERM GOALS: Target date: 05/23/2023  Pt will be independent with HEP in order to demonstrate participation in Physical Therapy POC.  Baseline: Goal status: INITIAL  2.  Pt will 1/10 pain during mobility in order to demonstrate improved pain with functional activities.  Baseline:  Goal status: IN PROGRESS  LONG TERM GOALS: Target date: 06/13/2023;   Pt will improve 30 second chair test by 2 in order to demonstrate improved functional strength to return to desired activities.  Baseline: See objective.  Goal status: MET  2.  Pt will perform 4 symmetrical, alternating descending stair negotiation in order to demonstrate increased eccentric control of L quadriceps without zero reports of pain or instability. Baseline: See objective.  Goal status: MET  3.  Pt will improve LEFS score by 10 points in order to demonstrate improved pain with functional goals and outcomes. Baseline: See objective.  Goal status: MET  4.  Pt will improve LLE MMT by >1/2 grade in order to reduce pain during functional ambulatory activities. Baseline: See objective.  Goal status: IN PROGRESS  5.  Pt will report ability to participate in at  least one time of dancing/recreational activities in order to demonstrate reduced Left knee pain. Baseline: See objective.  Goal status: IN PROGRESS   PLAN:  PT FREQUENCY: 1x/week  PT DURATION: 4 weeks  PLANNED INTERVENTIONS: 97164- PT Re-evaluation, 97110-Therapeutic exercises, 97530- Therapeutic activity, 97112-  Neuromuscular re-education, 97535- Self Care, 16109- Manual therapy, [id number]- Gait training, 97014- Electrical stimulation (unattended), [id number]- Electrical stimulation (manual), [id number]- Traction (mechanical), Patient/Family education, Balance training, and Stair training  PLAN FOR NEXT SESSION: quadriceps isometric training, hamstring strengthening, stair training, wants to get back to dancing.   Nelida Meuse PT, DPT [facility] [phone] office   Nelida Meuse, PT 06/30/2023, 12:28 PM

## 2023-06-30 NOTE — Assessment & Plan Note (Signed)
Well controlled with Prevacid 30 mg QD, sent as prescription

## 2023-06-30 NOTE — Assessment & Plan Note (Signed)
Has chronic diarrhea, reports lactose intolerance -avoids dairy products Intermittent constipation suggests IBS Continue fiber supplement and as needed Imodium for now Referred to GI

## 2023-06-30 NOTE — Assessment & Plan Note (Addendum)
Check lipid profile Advised to follow DASH diet, material provided

## 2023-06-30 NOTE — Assessment & Plan Note (Addendum)
 Has chronic myalgias, reports history of fibromyalgia Was responding well to topiramate , restart topiramate  50 mg QD

## 2023-07-04 ENCOUNTER — Encounter (INDEPENDENT_AMBULATORY_CARE_PROVIDER_SITE_OTHER): Payer: Self-pay | Admitting: *Deleted

## 2023-07-06 ENCOUNTER — Encounter (HOSPITAL_COMMUNITY): Payer: Commercial Managed Care - PPO

## 2023-07-14 ENCOUNTER — Emergency Department (HOSPITAL_COMMUNITY): Payer: Commercial Managed Care - PPO

## 2023-07-14 ENCOUNTER — Other Ambulatory Visit: Payer: Self-pay

## 2023-07-14 ENCOUNTER — Encounter (HOSPITAL_COMMUNITY): Payer: Self-pay

## 2023-07-14 ENCOUNTER — Encounter (HOSPITAL_COMMUNITY): Payer: Commercial Managed Care - PPO

## 2023-07-14 ENCOUNTER — Emergency Department (HOSPITAL_COMMUNITY)
Admission: EM | Admit: 2023-07-14 | Discharge: 2023-07-14 | Disposition: A | Discharge status: Home / Self Care | Payer: Commercial Managed Care - PPO | Attending: Emergency Medicine | Admitting: Emergency Medicine

## 2023-07-14 DIAGNOSIS — R509 Fever, unspecified: Secondary | ICD-10-CM | POA: Diagnosis present

## 2023-07-14 DIAGNOSIS — Z79899 Other long term (current) drug therapy: Secondary | ICD-10-CM | POA: Diagnosis not present

## 2023-07-14 DIAGNOSIS — J101 Influenza due to other identified influenza virus with other respiratory manifestations: Secondary | ICD-10-CM | POA: Diagnosis not present

## 2023-07-14 DIAGNOSIS — I10 Essential (primary) hypertension: Secondary | ICD-10-CM | POA: Diagnosis not present

## 2023-07-14 LAB — RESP PANEL BY RT-PCR (RSV, FLU A&B, COVID)  RVPGX2
Influenza A by PCR: POSITIVE — AB
Influenza B by PCR: NEGATIVE
Resp Syncytial Virus by PCR: NEGATIVE
SARS Coronavirus 2 by RT PCR: NEGATIVE

## 2023-07-14 MED ORDER — IBUPROFEN 800 MG PO TABS
800.0000 mg | ORAL_TABLET | Freq: Once | ORAL | Status: AC
  Administered 2023-07-14: 800 mg via ORAL
  Filled 2023-07-14: qty 1

## 2023-07-14 NOTE — ED Notes (Signed)
 Pt ambulated in hallway, RA sats initially at 95%, while ambulating sats down to 91-93%.  Pt denies any difficultly or dizziness.

## 2023-07-14 NOTE — Discharge Instructions (Signed)
 Rest,  Drink plenty of fluids.  Take motrin or tylenol for achiness and fever reduction.    Get rechecked for increased shortness of breath,  Increased fever or increasing weakness.  You should remain home until you have been fever free for 24 hours.

## 2023-07-14 NOTE — ED Provider Notes (Signed)
 [facility] AT [facility] Provider Note   CSN: [id number] Arrival date & time: 07/14/23  1311     History  Chief Complaint  Patient presents with   Shortness of Breath    Pt states shortness of breath began Tuesday, 2/25 around 1700    ELLENI MOZINGO is a 43 y.o. female with a history significant for hypertension, chronic back pain, fibromyalgia presenting for evaluation of a 3-day history of nonproductive cough, subjective fever, myalgias and shortness of breath with exertion.  She has been around other people with similar symptoms at work, specifically a coworker who had influenza.  She denies chest pain, cough has been nonproductive.  She denies peripheral edema, orthopnea, no abdominal pain, no nausea or vomiting.  She has had no treatment prior to arrival.  The history is provided by the patient.       Home Medications Prior to Admission medications   Medication Sig Start Date End Date Taking? Authorizing Provider  lansoprazole (PREVACID) 30 MG capsule Take 1 capsule (30 mg total) by mouth daily at 12 noon. 06/30/23   Anabel Halon, MD  losartan (COZAAR) 25 MG tablet Take 1 tablet (25 mg total) by mouth daily. 06/30/23   Anabel Halon, MD  rizatriptan (MAXALT) 10 MG tablet Take 1 tablet (10 mg total) by mouth as needed for migraine. May repeat in 2 hours if needed 06/30/23   Anabel Halon, MD  topiramate (TOPAMAX) 50 MG tablet Take 1 tablet (50 mg total) by mouth daily. 06/30/23   Anabel Halon, MD      Allergies    Ketorolac tromethamine, Sulfa antibiotics, and Ketorolac tromethamine    Review of Systems   Review of Systems  Constitutional:  Positive for fatigue and fever.  HENT:  Negative for congestion and sore throat.   Eyes: Negative.   Respiratory:  Positive for cough and shortness of breath. Negative for chest tightness.   Cardiovascular:  Negative for chest pain.  Gastrointestinal:  Negative for abdominal pain and nausea.   Genitourinary: Negative.   Musculoskeletal:  Positive for myalgias. Negative for arthralgias, joint swelling, neck pain and neck stiffness.  Skin: Negative.  Negative for rash and wound.  Neurological:  Positive for weakness. Negative for dizziness, light-headedness, numbness and headaches.  Psychiatric/Behavioral: Negative.      Physical Exam Updated Vital Signs BP 97/74 (BP Location: Right Arm)   Pulse 98   Temp 98.3 F (36.8 C) (Oral)   Resp 18   Ht 4' 11.5" (1.511 m)   Wt 122.5 kg   SpO2 93%   BMI 53.62 kg/m  Physical Exam Vitals and nursing note reviewed.  Constitutional:      Appearance: She is well-developed.  HENT:     Head: Normocephalic and atraumatic.  Eyes:     Conjunctiva/sclera: Conjunctivae normal.  Cardiovascular:     Rate and Rhythm: Normal rate and regular rhythm.     Heart sounds: Normal heart sounds.  Pulmonary:     Effort: Pulmonary effort is normal.     Breath sounds: Normal breath sounds. No decreased breath sounds, wheezing or rhonchi.  Abdominal:     General: Bowel sounds are normal.     Palpations: Abdomen is soft.     Tenderness: There is no abdominal tenderness.  Musculoskeletal:        General: Normal range of motion.     Cervical back: Normal range of motion.     Right lower leg: No  tenderness. No edema.     Left lower leg: No tenderness. No edema.  Skin:    General: Skin is warm and dry.  Neurological:     Mental Status: She is alert.     ED Results / Procedures / Treatments   Labs (all labs ordered are listed, but only abnormal results are displayed) Labs Reviewed  RESP PANEL BY RT-PCR (RSV, FLU A&B, COVID)  RVPGX2 - Abnormal; Notable for the following components:      Result Value   Influenza A by PCR POSITIVE (*)    All other components within normal limits    EKG None  Radiology DG Chest Portable 1 View Result Date: 07/14/2023 CLINICAL DATA:  Shortness of breath EXAM: PORTABLE CHEST 1 VIEW COMPARISON:  06/25/2021  FINDINGS: The heart size and mediastinal contours are within normal limits. No focal airspace consolidation, pleural effusion, or pneumothorax. The visualized skeletal structures are unremarkable. IMPRESSION: No active disease. Electronically Signed   By: Duanne Guess D.O.   On: 07/14/2023 18:25    Procedures Procedures    Medications Ordered in ED Medications  ibuprofen (ADVIL) tablet 800 mg (800 mg Oral Given 07/14/23 1749)    ED Course/ Medical Decision Making/ A&P                                 Medical Decision Making Patient presenting with symptoms suggesting flulike illness.  Differential also including COVID-19, nonspecific viral URI, RSV, pneumonia.  Symptoms could also be secondary to PE, however with fever and no history or exam suggesting DVT, this is unlikely.  She does test positive for influenza A per respiratory panel here.  She was ambulated and had no significant desaturation.  Chest x-ray is also reassuring.  Patient was stable for discharge home, she was given home instructions, return precautions.  Amount and/or Complexity of Data Reviewed Labs: ordered.    Details: Labs reviewed, influenza A positive. Radiology: ordered.    Details: Chest x-ray is negative for pneumonia.  Risk OTC drugs.             Final Clinical Impression(s) / ED Diagnoses Final diagnoses:  Influenza A    Rx / DC Orders ED Discharge Orders     None         Victoriano Lain 07/14/23 1943    Terrilee Files, MD 07/15/23 1112

## 2023-07-14 NOTE — ED Triage Notes (Signed)
 Pt states shortness of breath began Tuesday, 2/25 around 1700. No hx of HF, asthma. SOB with exertion, cough, fever, body aches.

## 2023-07-18 ENCOUNTER — Encounter (INDEPENDENT_AMBULATORY_CARE_PROVIDER_SITE_OTHER): Payer: Self-pay

## 2023-07-20 ENCOUNTER — Encounter (HOSPITAL_COMMUNITY): Payer: Commercial Managed Care - PPO

## 2023-07-20 ENCOUNTER — Encounter (HOSPITAL_COMMUNITY): Payer: Self-pay

## 2023-07-20 NOTE — Therapy (Signed)
 [facility] at [facility] [address] [place], [place], [address] Phone: [phone]   Fax:  [phone]  Patient Details  Name: Jennifer Gonzales MRN: [id number] Date of Birth: [birth year] Referring Provider:  No ref. provider found  Encounter Date: 07/20/2023  Pt was called regarding # 2 no show. Attempted to leave voicemail, but mailbox full. Called pt's alternate contact and reporting she will have pt call back to office. Pt called back. Decided to DC as pt has had multiple battles with sickness and limiting PT progress. Pt in agreement and advised retrieve another script from Orthopedic provider once feeling better.   PHYSICAL THERAPY DISCHARGE SUMMARY  Visits from Start of Care: 4  Current functional level related to goals / functional outcomes: Not met   Remaining deficits: Knee pain, ROM deficits, and MMT deficits.    Education / Equipment: Advised to retrieve new PT order once feeling better.    Patient agrees to discharge. Patient goals were not met. Patient is being discharged due to lack of progress.  Nelida Meuse PT, DPT [facility] [phone] office.   Nelida Meuse, PT 07/20/2023, 1:12 PM  [facility] at [facility] [address] [place], [place], [address] Phone: [phone]   Fax:  [phone]

## 2023-07-28 ENCOUNTER — Ambulatory Visit (INDEPENDENT_AMBULATORY_CARE_PROVIDER_SITE_OTHER): Payer: Commercial Managed Care - PPO | Admitting: Gastroenterology

## 2023-08-11 ENCOUNTER — Other Ambulatory Visit (HOSPITAL_COMMUNITY)
Admission: RE | Admit: 2023-08-11 | Discharge: 2023-08-11 | Disposition: A | Discharge status: Home / Self Care | Source: Ambulatory Visit | Attending: Adult Health | Admitting: Adult Health

## 2023-08-11 ENCOUNTER — Ambulatory Visit: Payer: Commercial Managed Care - PPO | Admitting: Adult Health

## 2023-08-11 ENCOUNTER — Encounter: Payer: Self-pay | Admitting: Adult Health

## 2023-08-11 ENCOUNTER — Ambulatory Visit: Payer: Commercial Managed Care - PPO

## 2023-08-11 VITALS — BP 140/82 | HR 92 | Ht 59.5 in | Wt 269.0 lb

## 2023-08-11 DIAGNOSIS — Z8742 Personal history of other diseases of the female genital tract: Secondary | ICD-10-CM | POA: Insufficient documentation

## 2023-08-11 DIAGNOSIS — Z1211 Encounter for screening for malignant neoplasm of colon: Secondary | ICD-10-CM

## 2023-08-11 DIAGNOSIS — Z3042 Encounter for surveillance of injectable contraceptive: Secondary | ICD-10-CM

## 2023-08-11 DIAGNOSIS — Z01419 Encounter for gynecological examination (general) (routine) without abnormal findings: Secondary | ICD-10-CM | POA: Insufficient documentation

## 2023-08-11 DIAGNOSIS — Z1331 Encounter for screening for depression: Secondary | ICD-10-CM | POA: Diagnosis not present

## 2023-08-11 DIAGNOSIS — I10 Essential (primary) hypertension: Secondary | ICD-10-CM

## 2023-08-11 LAB — HEMOCCULT GUIAC POC 1CARD (OFFICE): Fecal Occult Blood, POC: NEGATIVE

## 2023-08-11 MED ORDER — MEDROXYPROGESTERONE ACETATE 150 MG/ML IM SUSP
150.0000 mg | INTRAMUSCULAR | 4 refills | Status: AC
Start: 2023-08-11 — End: ?

## 2023-08-11 MED ORDER — MEDROXYPROGESTERONE ACETATE 150 MG/ML IM SUSY
150.0000 mg | PREFILLED_SYRINGE | Freq: Once | INTRAMUSCULAR | Status: AC
  Administered 2023-08-11: 150 mg via INTRAMUSCULAR

## 2023-08-11 NOTE — Progress Notes (Signed)
 Patient ID: Jennifer Gonzales, female   DOB: Aug 16, 1980, 43 y.o.   MRN: [id number] History of Present Illness: Jennifer Gonzales is a 43 year old white female, divorced, G2P2 in for a well woman gyn exam and pap.  Her last pap was + HPV other and 18/45 NILM, and she never got colpo.   She is getting depo today too. She is still working at the [employer].  PCP is Dr Allena Katz  Current Medications, Allergies, Past Medical History, Past Surgical History, Family History and Social History were reviewed in [employer] [facility] electronic medical record.     Review of Systems: Patient denies any headaches, hearing loss, fatigue, blurred vision, shortness of breath, chest pain, abdominal pain, problems with bowel movements, urination, or intercourse(not active). No joint pain or mood swings.     Physical Exam:BP (!) 140/82 (BP Location: Left Arm, Patient Position: Sitting, Cuff Size: Large)   Pulse 92   Ht 4' 11.5" (1.511 m)   Wt 269 lb (122 kg)   BMI 53.42 kg/m   General:  Well developed, well nourished, no acute distress Skin:  Warm and dry Neck:  Midline trachea, normal thyroid, good ROM, no lymphadenopathy Lungs; Clear to auscultation bilaterally Breast:  No dominant palpable mass, retraction, or nipple discharge, right nipple inverted for ever Cardiovascular: Regular rate and rhythm Abdomen:  Soft, non tender, no hepatosplenomegaly,obese Pelvic:  External genitalia is normal in appearance, no lesions.  The vagina is normal in appearance. Urethra has no lesions or masses. The cervix is bulbous.pap with HR HPV genotyping performed.  Uterus is felt to be normal size, shape, and contour.  No adnexal masses or tenderness noted.Bladder is non tender, no masses felt. Rectal: Good sphincter tone, no polyps, or hemorrhoids felt.  Hemoccult negative. Extremities/musculoskeletal:  No swelling or varicosities noted, no clubbing or cyanosis Psych:  No mood changes, alert and cooperative,seems happy AA is 2 Fall risk is  moderate    08/11/2023   10:52 AM 06/30/2023    8:56 AM 06/30/2023    8:01 AM  Depression screen PHQ 2/9  Decreased Interest 0 0 0  Down, Depressed, Hopeless 0 0 0  PHQ - 2 Score 0 0 0  Altered sleeping 1 3 0  Tired, decreased energy 1 3 0  Change in appetite 1 2 0  Feeling bad or failure about yourself  1 1 0  Trouble concentrating 0 0 0  Moving slowly or fidgety/restless 0 1 0  Suicidal thoughts 0 0 0  PHQ-9 Score 4 10 0  Difficult doing work/chores  Not difficult at all Not difficult at all       08/11/2023   10:52 AM 06/30/2023    8:56 AM 06/30/2023    8:01 AM 07/16/2021   10:38 AM  GAD 7 : Generalized Anxiety Score  Nervous, Anxious, on Edge 0 1 0 0  Control/stop worrying 1 3 0 1  Worry too much - different things 1 3 0 1  Trouble relaxing 1 3 0 1  Restless 1 2 0 1  Easily annoyed or irritable 0 1 0 1  Afraid - awful might happen 0 0 0 0  Total GAD 7 Score 4 13 0 5  Anxiety Difficulty  Not difficult at all Not difficult at all     Upstream - 08/11/23 1052       Pregnancy Intention Screening   Does the patient want to become pregnant in the next year? N/A    Does the patient's partner want  to become pregnant in the next year? N/A    Would the patient like to discuss contraceptive options today? N/A      Contraception Wrap Up   Current Method Abstinence;Hormonal Injection    End Method Abstinence;Hormonal Injection    Contraception Counseling Provided Yes              Examination chaperoned by Malachy Mood LPN  Impression and Plan: 1. Encounter for gynecological examination with Papanicolaou smear of cervix (Primary) Pap sent Next pap TBD Physical in 1 year Labs with PCP  - Cytology - PAP( [facility])  2. Encounter for surveillance of injectable contraceptive Got depo in office today and refill rx Meds ordered this encounter  Medications   medroxyPROGESTERone (DEPO-PROVERA) 150 MG/ML injection    Sig: Inject 1 mL (150 mg total) into the muscle every 3  (three) months.    Dispense:  1 mL    Refill:  4    Supervising Provider:   Duane Lope H [[id number]]   medroxyPROGESTERone Acetate SUSY 150 mg    - medroxyPROGESTERone Acetate SUSY 150 mg  3. Essential hypertension Take meds and Follow up with PCP  4. Encounter for screening fecal occult blood testing Hemoccult was negative  - POCT occult blood stool  5. History of abnormal cervical Pap smear Pap sent

## 2023-08-15 LAB — CYTOLOGY - PAP
Adequacy: ABSENT
Comment: NEGATIVE
Comment: NEGATIVE
Comment: NEGATIVE
Diagnosis: UNDETERMINED — AB
HPV 16: NEGATIVE
HPV 18 / 45: POSITIVE — AB
High risk HPV: POSITIVE — AB

## 2023-08-16 ENCOUNTER — Ambulatory Visit (INDEPENDENT_AMBULATORY_CARE_PROVIDER_SITE_OTHER): Admitting: Gastroenterology

## 2023-08-16 ENCOUNTER — Encounter: Payer: Self-pay | Admitting: Adult Health

## 2023-08-16 DIAGNOSIS — R87610 Atypical squamous cells of undetermined significance on cytologic smear of cervix (ASC-US): Secondary | ICD-10-CM | POA: Insufficient documentation

## 2023-09-20 ENCOUNTER — Encounter (HOSPITAL_COMMUNITY): Payer: Self-pay

## 2023-09-25 ENCOUNTER — Other Ambulatory Visit: Payer: Self-pay | Admitting: Internal Medicine

## 2023-09-25 DIAGNOSIS — I10 Essential (primary) hypertension: Secondary | ICD-10-CM

## 2023-09-27 ENCOUNTER — Telehealth: Payer: Self-pay | Admitting: Pharmacy Technician

## 2023-09-27 ENCOUNTER — Other Ambulatory Visit (HOSPITAL_COMMUNITY): Payer: Self-pay

## 2023-09-27 ENCOUNTER — Ambulatory Visit (INDEPENDENT_AMBULATORY_CARE_PROVIDER_SITE_OTHER): Payer: Commercial Managed Care - PPO | Admitting: Internal Medicine

## 2023-09-27 ENCOUNTER — Encounter: Payer: Self-pay | Admitting: Internal Medicine

## 2023-09-27 VITALS — BP 128/74 | HR 104 | Ht 59.5 in | Wt 274.4 lb

## 2023-09-27 DIAGNOSIS — I10 Essential (primary) hypertension: Secondary | ICD-10-CM | POA: Diagnosis not present

## 2023-09-27 DIAGNOSIS — K219 Gastro-esophageal reflux disease without esophagitis: Secondary | ICD-10-CM

## 2023-09-27 DIAGNOSIS — G43E19 Chronic migraine with aura, intractable, without status migrainosus: Secondary | ICD-10-CM

## 2023-09-27 DIAGNOSIS — G8929 Other chronic pain: Secondary | ICD-10-CM

## 2023-09-27 DIAGNOSIS — M25561 Pain in right knee: Secondary | ICD-10-CM

## 2023-09-27 DIAGNOSIS — M25562 Pain in left knee: Secondary | ICD-10-CM

## 2023-09-27 DIAGNOSIS — M797 Fibromyalgia: Secondary | ICD-10-CM | POA: Diagnosis not present

## 2023-09-27 MED ORDER — SEMAGLUTIDE-WEIGHT MANAGEMENT 1 MG/0.5ML ~~LOC~~ SOAJ: 1.0000 mg | SUBCUTANEOUS | 0 refills | Status: AC

## 2023-09-27 MED ORDER — TOPIRAMATE 50 MG PO TABS: 50.0000 mg | ORAL_TABLET | Freq: Every day | ORAL | 1 refills | Status: DC

## 2023-09-27 MED ORDER — MELOXICAM 15 MG PO TABS: 15.0000 mg | ORAL_TABLET | Freq: Every day | ORAL | 5 refills | Status: AC

## 2023-09-27 MED ORDER — SEMAGLUTIDE-WEIGHT MANAGEMENT 0.25 MG/0.5ML ~~LOC~~ SOAJ: 0.2500 mg | SUBCUTANEOUS | 0 refills | Status: AC

## 2023-09-27 MED ORDER — SEMAGLUTIDE-WEIGHT MANAGEMENT 0.5 MG/0.5ML ~~LOC~~ SOAJ: 0.5000 mg | SUBCUTANEOUS | 0 refills | Status: AC

## 2023-09-27 NOTE — Assessment & Plan Note (Addendum)
 Has chronic myalgias, reports history of fibromyalgia Responding well to topiramate  50 mg QD

## 2023-09-27 NOTE — Assessment & Plan Note (Signed)
 BMI Readings from Last 3 Encounters:  09/27/23 54.49 kg/m  08/11/23 53.42 kg/m  07/14/23 53.62 kg/m   Advised to follow DASH diet and perform moderate exercise/walking at least 150 minutes/week Unable to lose weight with low-carb diet Discussed about medical weight loss options, including benefits and side effects Started Wegovy-initial BMI: 54.49, increase dose as tolerated

## 2023-09-27 NOTE — Telephone Encounter (Signed)
 Pharmacy Patient Advocate Encounter   Received notification from CoverMyMeds that prior authorization for Wegovy 0.25MG /0.5ML auto-injectors is required/requested.   Insurance verification completed.   The patient is insured through [employer] .   Per test claim: PA required; PA submitted to above mentioned insurance via CoverMyMeds Key/confirmation #/EOC [id number] Status is pending

## 2023-09-27 NOTE — Assessment & Plan Note (Addendum)
 Well-controlled On topiramate  50 mg once daily Maxalt  as needed for breakthrough migraine, has tried Imitrex in the past without much relief

## 2023-09-27 NOTE — Assessment & Plan Note (Signed)
 Previous x-ray of left knee showed degenerative changes She reports that she had an MRI of knee as well, which showed ligament injury and bursitis Takes meloxicam 15 mg QD for pain, refilled - advised to alternate with Tylenol  arthritis Followed by orthopedic surgery-has had intra articular steroid injection, currently getting PT

## 2023-09-27 NOTE — Assessment & Plan Note (Addendum)
 Well controlled with Prevacid  30 mg QD

## 2023-09-27 NOTE — Telephone Encounter (Signed)
 Pharmacy Patient Advocate Encounter  Received notification from [employer] that Prior Authorization for Wegovy 0.25MG /0.5ML auto-injectors has been APPROVED from 08/28/2023 to 04/24/2024. Ran test claim, Copay is $250.00. This test claim was processed through [facility]- copay amounts may vary at other pharmacies due to pharmacy/plan contracts, or as the patient moves through the different stages of their insurance plan.   PA #/Case ID/Reference #: [id number]  PT DEDUCTIBLE WAS $150.00 PLUS THE $100.00 COPAY.

## 2023-09-27 NOTE — Patient Instructions (Signed)
 Please start taking Wegovy 0.25 mg once weekly for 4 weeks, then 0.5 mg for 4 weeks and then 1 mg.  Please continue to take medications as prescribed.  Please continue to follow low carb diet and perform moderate exercise/walking as tolerated.

## 2023-09-27 NOTE — Progress Notes (Signed)
 Established Patient Office Visit  Subjective:  Patient ID: Jennifer Gonzales, female    DOB: 02-26-1981  Age: 43 y.o. MRN: [id number]  CC:  Chief Complaint  Patient presents with   Medical Management of Chronic Issues    3 month f/u, would like to discuss weight loss options, has been eating healthy and trying and unable to drop any lbs.     HPI Jennifer Gonzales is a 43 y.o. female with past medical history of HTN, migraine and morbid obesity who presents for f/u of her chronic medical conditions.  HTN: Her BP is WNL today.  She was placed on losartan  25 mg QD in the last visit, but had low BP according to her nurse at workplace.  She has been taking losartan  25 mg QOD currently.  Denies any chest pain, dyspnea or palpitations currently.  Fibromyalgia: She reports history of fibromyalgia and has responded well to topiramate  50 mg QD.  History of migraine: She has longstanding history of migraine, usually gets about 3 episodes in a month with nausea, photophobia, phonophobia.  She used to take topiramate , which had helped her with frequency and intensity of migraine.  She usually goes in a dark room and puts ice bags around her head to get relief.  Chronic left knee pain: Has been evaluated by orthopedic surgeon. She has completed physical therapy. She has noticed the right knee is starting to bother her more recently as she has been putting more pressure on right knee to avoid left-sided knee pain. She was doing a lot better until 06/19/23, when she ran for a medical emergency at work.  She is currently taking meloxicam 15 mg QD.  Morbid obesity: She has been following low-carb diet for the last 6 months.  She has cut down salt drink intake and red meat.  She is trying to increase physical activity as well.  She is frustrated as she is not able to lose weight despite her efforts.  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/05/2016   Fibromyalgia    Hx of migraines    Hypertension    Mental disorder    Papanicolaou smear of cervix with positive high risk human papilloma virus (HPV) test 04/02/2019   03/2019, +HPV 18 needs colpo___   Vaginal Pap smear, abnormal     Past Surgical History:  Procedure Laterality Date   BACK SURGERY     CESAREAN SECTION     CHOLECYSTECTOMY     TONSILLECTOMY      Family History  Problem Relation Age of Onset   Hypertension Other    Diabetes Other    Stroke Other    Heart disease Other    Cancer Paternal Grandmother        breast   Diabetes Father    Muscular dystrophy Mother    Muscular dystrophy Brother    Muscular dystrophy Sister    Muscular dystrophy Sister    Asthma Son    Other Son        allergies    Social History   Socioeconomic History   Marital status: Divorced    Spouse name: Not on file   Number of children: 2   Years of education: Not on file   Highest education level: Not on file  Occupational History   Not on file  Tobacco Use   Smoking  status: Every Day    Current packs/day: 0.25    Average packs/day: 0.3 packs/day for 23.0 years (5.8 ttl pk-yrs)    Types: Cigarettes   Smokeless tobacco: Never  Vaping Use   Vaping status: Former  Substance and Sexual Activity   Alcohol use: Not Currently   Drug use: No   Sexual activity: Not Currently    Birth control/protection: Injection  Other Topics Concern   Not on file  Social History Narrative   Not on file   Social Drivers of Health   Financial Resource Strain: Low Risk  (08/11/2023)   Overall Financial Resource Strain (CARDIA)    Difficulty of Paying Living Expenses: Not hard at all  Food Insecurity: No Food Insecurity (08/11/2023)   Hunger Vital Sign    Worried About Running Out of Food in the Last Year: Never true    Ran Out of Food in the Last Year: Never true  Transportation Needs: No Transportation Needs (08/11/2023)   PRAPARE -  Administrator, Civil Service (Medical): No    Lack of Transportation (Non-Medical): No  Physical Activity: Insufficiently Active (08/11/2023)   Exercise Vital Sign    Days of Exercise per Week: 2 days    Minutes of Exercise per Session: 20 min  Stress: Stress Concern Present (08/11/2023)   [employer] of Occupational Health - Occupational Stress Questionnaire    Feeling of Stress : To some extent  Social Connections: Socially Isolated (08/11/2023)   Social Connection and Isolation Panel [NHANES]    Frequency of Communication with Friends and Family: More than three times a week    Frequency of Social Gatherings with Friends and Family: More than three times a week    Attends Religious Services: Never    Database administrator or Organizations: No    Attends Banker Meetings: Never    Marital Status: Divorced  Catering manager Violence: Not At Risk (08/11/2023)   Humiliation, Afraid, Rape, and Kick questionnaire    Fear of Current or Ex-Partner: No    Emotionally Abused: No    Physically Abused: No    Sexually Abused: No    ROS Review of Systems  Constitutional:  Negative for chills and fever.  HENT:  Negative for congestion, sinus pressure, sinus pain and sore throat.   Eyes:  Negative for pain and discharge.  Respiratory:  Negative for cough and shortness of breath.   Cardiovascular:  Negative for chest pain and palpitations.  Gastrointestinal:  Positive for diarrhea. Negative for abdominal pain, nausea and vomiting.  Endocrine: Negative for polydipsia and polyuria.  Genitourinary:  Negative for dysuria and hematuria.  Musculoskeletal:  Positive for arthralgias (B/l knee) and myalgias. Negative for neck pain and neck stiffness.  Skin:  Negative for rash.  Neurological:  Negative for dizziness and weakness.  Psychiatric/Behavioral:  Positive for sleep disturbance. Negative for agitation and behavioral problems.     Objective:   Today's Vitals: BP  128/74 (BP Location: Left Arm)   Pulse (!) 104   Ht 4' 11.5" (1.511 m)   Wt 274 lb 6.4 oz (124.5 kg)   SpO2 95%   BMI 54.49 kg/m   Physical Exam Vitals reviewed.  Constitutional:      General: She is not in acute distress.    Appearance: She is obese. She is not diaphoretic.  HENT:     Head: Normocephalic and atraumatic.     Nose: Nose normal.     Mouth/Throat:  Mouth: Mucous membranes are moist.  Eyes:     General: No scleral icterus.    Extraocular Movements: Extraocular movements intact.  Cardiovascular:     Rate and Rhythm: Normal rate and regular rhythm.     Heart sounds: Normal heart sounds. No murmur heard. Pulmonary:     Breath sounds: Normal breath sounds. No wheezing or rales.  Musculoskeletal:     Cervical back: Neck supple. No tenderness.     Right lower leg: No edema.     Left lower leg: No edema.  Skin:    General: Skin is warm.     Findings: No rash.  Neurological:     General: No focal deficit present.     Mental Status: She is alert and oriented to person, place, and time.  Psychiatric:        Mood and Affect: Mood normal.        Behavior: Behavior normal.     Assessment & Plan:   Problem List Items Addressed This Visit       Cardiovascular and Mediastinum   Migraine   Well-controlled On topiramate  50 mg once daily Maxalt  as needed for breakthrough migraine, has tried Imitrex in the past without much relief      Relevant Medications   topiramate  (TOPAMAX ) 50 MG tablet   meloxicam (MOBIC) 15 MG tablet   Essential hypertension - Primary   BP Readings from Last 1 Encounters:  09/27/23 128/74   Well-controlled with losartan  25 mg QOD Advised to take losartan  half tablet once daily for now Counseled for compliance with the medications Advised DASH diet and moderate exercise/walking, at least 150 mins/week         Digestive   GERD (gastroesophageal reflux disease)   Well controlled with Prevacid  30 mg QD        Other    Fibromyalgia (Chronic)   Has chronic myalgias, reports history of fibromyalgia Responding well to topiramate  50 mg QD      Relevant Medications   topiramate  (TOPAMAX ) 50 MG tablet   meloxicam (MOBIC) 15 MG tablet   Morbid obesity (HCC)   BMI Readings from Last 3 Encounters:  09/27/23 54.49 kg/m  08/11/23 53.42 kg/m  07/14/23 53.62 kg/m   Advised to follow DASH diet and perform moderate exercise/walking at least 150 minutes/week Unable to lose weight with low-carb diet Discussed about medical weight loss options, including benefits and side effects Started Wegovy-initial BMI: 54.49, increase dose as tolerated      Relevant Medications   Semaglutide-Weight Management 0.25 MG/0.5ML SOAJ   Semaglutide-Weight Management 0.5 MG/0.5ML SOAJ (Start on 10/26/2023)   Semaglutide-Weight Management 1 MG/0.5ML SOAJ (Start on 11/24/2023)   Chronic pain of both knees   Previous x-ray of left knee showed degenerative changes She reports that she had an MRI of knee as well, which showed ligament injury and bursitis Takes meloxicam 15 mg QD for pain, refilled - advised to alternate with Tylenol  arthritis Followed by orthopedic surgery-has had intra articular steroid injection, currently getting PT      Relevant Medications   topiramate  (TOPAMAX ) 50 MG tablet   meloxicam (MOBIC) 15 MG tablet    Outpatient Encounter Medications as of 09/27/2023  Medication Sig   Cetirizine HCl (ZYRTEC PO) Take by mouth.   lansoprazole  (PREVACID ) 30 MG capsule Take 1 capsule (30 mg total) by mouth daily at 12 noon.   losartan  (COZAAR ) 25 MG tablet TAKE 1 TABLET(25 MG) BY MOUTH DAILY   medroxyPROGESTERone  (DEPO-PROVERA ) 150 MG/ML injection  Inject 1 mL (150 mg total) into the muscle every 3 (three) months.   rizatriptan  (MAXALT ) 10 MG tablet Take 1 tablet (10 mg total) by mouth as needed for migraine. May repeat in 2 hours if needed   Semaglutide-Weight Management 0.25 MG/0.5ML SOAJ Inject 0.25 mg into the skin  once a week for 28 days.   [START ON 10/26/2023] Semaglutide-Weight Management 0.5 MG/0.5ML SOAJ Inject 0.5 mg into the skin once a week for 28 days.   [START ON 11/24/2023] Semaglutide-Weight Management 1 MG/0.5ML SOAJ Inject 1 mg into the skin once a week for 28 days.   [DISCONTINUED] meloxicam (MOBIC) 15 MG tablet Take 15 mg by mouth daily.   [DISCONTINUED] topiramate  (TOPAMAX ) 50 MG tablet Take 1 tablet (50 mg total) by mouth daily.   meloxicam (MOBIC) 15 MG tablet Take 1 tablet (15 mg total) by mouth daily.   topiramate  (TOPAMAX ) 50 MG tablet Take 1 tablet (50 mg total) by mouth daily.   No facility-administered encounter medications on file as of 09/27/2023.    Follow-up: Return in about 3 months (around 12/28/2023) for HTN and weight management.   Meldon Sport, MD

## 2023-09-27 NOTE — Assessment & Plan Note (Addendum)
 BP Readings from Last 1 Encounters:  09/27/23 128/74   Well-controlled with losartan  25 mg QOD Advised to take losartan  half tablet once daily for now Counseled for compliance with the medications Advised DASH diet and moderate exercise/walking, at least 150 mins/week

## 2023-09-29 ENCOUNTER — Ambulatory Visit: Payer: Self-pay | Admitting: Internal Medicine

## 2023-09-29 LAB — CMP14+EGFR
ALT: 14 IU/L (ref 0–32)
AST: 12 IU/L (ref 0–40)
Albumin: 4.1 g/dL (ref 3.9–4.9)
Alkaline Phosphatase: 81 IU/L (ref 44–121)
BUN/Creatinine Ratio: 11 (ref 9–23)
BUN: 9 mg/dL (ref 6–24)
Bilirubin Total: 0.5 mg/dL (ref 0.0–1.2)
CO2: 20 mmol/L (ref 20–29)
Calcium: 9.3 mg/dL (ref 8.7–10.2)
Chloride: 103 mmol/L (ref 96–106)
Creatinine, Ser: 0.85 mg/dL (ref 0.57–1.00)
Globulin, Total: 2.6 g/dL (ref 1.5–4.5)
Glucose: 104 mg/dL — ABNORMAL HIGH (ref 70–99)
Potassium: 4.6 mmol/L (ref 3.5–5.2)
Sodium: 139 mmol/L (ref 134–144)
Total Protein: 6.7 g/dL (ref 6.0–8.5)
eGFR: 88 mL/min/{1.73_m2} (ref >59)

## 2023-09-29 LAB — TSH+FREE T4
Free T4: 0.9 ng/dL (ref 0.82–1.77)
TSH: 3.32 u[IU]/mL (ref 0.450–4.500)

## 2023-09-29 LAB — LIPID PANEL
Chol/HDL Ratio: 6.5 ratio — ABNORMAL HIGH (ref 0.0–4.4)
Cholesterol, Total: 181 mg/dL (ref 100–199)
HDL: 28 mg/dL — ABNORMAL LOW (ref >39)
LDL Chol Calc (NIH): 136 mg/dL — ABNORMAL HIGH (ref 0–99)
Triglycerides: 89 mg/dL (ref 0–149)
VLDL Cholesterol Cal: 17 mg/dL (ref 5–40)

## 2023-09-29 LAB — HEMOGLOBIN A1C
Est. average glucose Bld gHb Est-mCnc: 131 mg/dL
Hgb A1c MFr Bld: 6.2 % — ABNORMAL HIGH (ref 4.8–5.6)

## 2023-09-29 LAB — HIV ANTIBODY (ROUTINE TESTING W REFLEX): HIV Screen 4th Generation wRfx: NONREACTIVE

## 2023-09-29 LAB — HEPATITIS C ANTIBODY: Hep C Virus Ab: NONREACTIVE

## 2023-11-03 ENCOUNTER — Ambulatory Visit: Admitting: *Deleted

## 2023-11-03 DIAGNOSIS — Z3042 Encounter for surveillance of injectable contraceptive: Secondary | ICD-10-CM

## 2023-11-03 MED ORDER — MEDROXYPROGESTERONE ACETATE 150 MG/ML IM SUSP
150.0000 mg | Freq: Once | INTRAMUSCULAR | Status: AC
  Administered 2023-11-03: 150 mg via INTRAMUSCULAR

## 2023-11-03 NOTE — Progress Notes (Signed)
   NURSE VISIT- INJECTION  SUBJECTIVE:  Jennifer Gonzales is a 43 y.o. G15P2002 female here for a Depo Provera  for contraception/period management. She is a GYN patient.   OBJECTIVE:  There were no vitals taken for this visit.  Appears well, in no apparent distress  Injection administered in: Left deltoid  Meds ordered this encounter  Medications   medroxyPROGESTERone  (DEPO-PROVERA ) injection 150 mg    ASSESSMENT: GYN patient Depo Provera  for contraception/period management PLAN: Follow-up: in 11-13 weeks for next Depo   Laverne Potter  11/03/2023 9:33 AM

## 2023-11-23 ENCOUNTER — Telehealth: Payer: Self-pay | Admitting: Pharmacy Technician

## 2023-11-23 ENCOUNTER — Telehealth: Payer: Self-pay | Admitting: Internal Medicine

## 2023-11-23 ENCOUNTER — Other Ambulatory Visit (HOSPITAL_COMMUNITY): Payer: Self-pay

## 2023-11-23 NOTE — Telephone Encounter (Signed)
 Copied from CRM [id number]. Topic: Clinical - Medication Prior Auth >> Nov 23, 2023  3:45 PM Mia F wrote: Reason for CRM: Pt called in stating that the pharmacy told her that a piror auth is needed for the medication Semaglutide -Weight Management 1 MG/0.5ML. She says that she thought it was done already. She is due to take the medication tonight and does not have it.   Pharmacy [facility] [phone] - [place], [place] - [address] AT [facility] [address] (Ph: [phone])

## 2023-11-23 NOTE — Telephone Encounter (Signed)
 Pharmacy Patient Advocate Encounter   Received notification from CoverMyMeds that prior authorization for Wegovy  1MG /0.5ML auto-injectors is required/requested.   Insurance verification completed.   The patient is insured through [facility] .   Per test claim:   Patient must enroll in virtual care program through [place]. Prior authorization cannot be submitted until enrollment is complete. Please have the patient contact their health plan to inquire on how to enroll. Once enrollment is complete patient will need to reach back our to us  for prior authorization to be be submitted. This is a new plan requirement.

## 2023-11-23 NOTE — Telephone Encounter (Signed)
 Spoke to pt informed her of insurance requirement agreed to call back once step has been completed she will give the office a call.

## 2023-11-23 NOTE — Telephone Encounter (Signed)
 Please refer to other prior auth TE

## 2023-11-24 ENCOUNTER — Other Ambulatory Visit (HOSPITAL_COMMUNITY): Payer: Self-pay

## 2023-11-24 ENCOUNTER — Telehealth: Payer: Self-pay

## 2023-11-24 NOTE — Telephone Encounter (Signed)
 Copied from CRM [id number]. Topic: Clinical - Medication Question >> Nov 24, 2023  8:56 AM Marissa P wrote: Reason for CRM: Patient is confused as to what to do when it comes to her medication Wegovey, insurance says approved but the pharmacy still says they need prior authorization. Please contact patient back to clear all this up

## 2023-11-24 NOTE — Telephone Encounter (Signed)
 PA request has been Started. New Encounter has been or will be created for follow up. For additional info see Pharmacy Prior Auth telephone encounter from 11/23/2023.  The insurance terminated all approved PA's that were on file when they switched to [employer]. New prior authorizations have to be obtained. This has been submitted and is pending.

## 2023-11-24 NOTE — Telephone Encounter (Signed)
 Pharmacy Patient Advocate Encounter   Received notification from CoverMyMeds that prior authorization for Wegovy  1MG /0.5ML auto-injectors  is required/requested.   Insurance verification completed.   The patient is insured through [employer] .   Per test claim: PA required; PA submitted to above mentioned insurance via CoverMyMeds Key/confirmation #/EOC [id number] Status is pending

## 2023-11-27 ENCOUNTER — Other Ambulatory Visit (HOSPITAL_COMMUNITY): Payer: Self-pay

## 2023-12-05 ENCOUNTER — Other Ambulatory Visit (HOSPITAL_COMMUNITY): Payer: Self-pay

## 2023-12-05 NOTE — Telephone Encounter (Signed)
 Pharmacy Patient Advocate Encounter  Received notification from [employer] that Prior Authorization for Wegovy  1MG /0.5ML auto-injectors has been APPROVED from 12/05/2023 to 07/02/2024. Ran test claim, Copay is $250.00. This test claim was processed through [facility]- copay amounts may vary at other pharmacies due to pharmacy/plan contracts, or as the patient moves through the different stages of their insurance plan.   PA #/Case ID/Reference #: [id number]  Copay applied to the deductible of $150.00 plus the $100.00 copay.

## 2023-12-08 ENCOUNTER — Emergency Department (HOSPITAL_BASED_OUTPATIENT_CLINIC_OR_DEPARTMENT_OTHER)
Admission: EM | Admit: 2023-12-08 | Discharge: 2023-12-08 | Disposition: A | Discharge status: Home / Self Care | Attending: Emergency Medicine | Admitting: Emergency Medicine

## 2023-12-08 DIAGNOSIS — I10 Essential (primary) hypertension: Secondary | ICD-10-CM | POA: Insufficient documentation

## 2023-12-08 DIAGNOSIS — D72829 Elevated white blood cell count, unspecified: Secondary | ICD-10-CM | POA: Diagnosis not present

## 2023-12-08 DIAGNOSIS — R112 Nausea with vomiting, unspecified: Secondary | ICD-10-CM | POA: Insufficient documentation

## 2023-12-08 LAB — COMPREHENSIVE METABOLIC PANEL WITH GFR
ALT: 14 U/L (ref 0–44)
AST: 15 U/L (ref 15–41)
Albumin: 4.5 g/dL (ref 3.5–5.0)
Alkaline Phosphatase: 85 U/L (ref 38–126)
Anion gap: 13 (ref 5–15)
BUN: 8 mg/dL (ref 6–20)
CO2: 24 mmol/L (ref 22–32)
Calcium: 9.9 mg/dL (ref 8.9–10.3)
Chloride: 102 mmol/L (ref 98–111)
Creatinine, Ser: 0.92 mg/dL (ref 0.44–1.00)
GFR, Estimated: >60 mL/min (ref >60)
Glucose, Bld: 117 mg/dL — ABNORMAL HIGH (ref 70–99)
Potassium: 4.5 mmol/L (ref 3.5–5.1)
Sodium: 139 mmol/L (ref 135–145)
Total Bilirubin: 0.5 mg/dL (ref 0.0–1.2)
Total Protein: 7.9 g/dL (ref 6.5–8.1)

## 2023-12-08 LAB — CBC
HCT: 47.9 % — ABNORMAL HIGH (ref 36.0–46.0)
Hemoglobin: 16.1 g/dL — ABNORMAL HIGH (ref 12.0–15.0)
MCH: 32.5 pg (ref 26.0–34.0)
MCHC: 33.6 g/dL (ref 30.0–36.0)
MCV: 96.6 fL (ref 80.0–100.0)
Platelets: 312 K/uL (ref 150–400)
RBC: 4.96 MIL/uL (ref 3.87–5.11)
RDW: 12.5 % (ref 11.5–15.5)
WBC: 13.1 K/uL — ABNORMAL HIGH (ref 4.0–10.5)
nRBC: 0 % (ref 0.0–0.2)

## 2023-12-08 LAB — HCG, SERUM, QUALITATIVE: Preg, Serum: NEGATIVE

## 2023-12-08 LAB — LIPASE, BLOOD: Lipase: 29 U/L (ref 11–51)

## 2023-12-08 MED ORDER — ONDANSETRON 4 MG PO TBDP: 4.0000 mg | ORAL_TABLET | Freq: Three times a day (TID) | ORAL | 0 refills | Status: DC | PRN

## 2023-12-08 MED ORDER — SODIUM CHLORIDE 0.9 % IV BOLUS
1000.0000 mL | Freq: Once | INTRAVENOUS | Status: AC
  Administered 2023-12-08: 1000 mL via INTRAVENOUS

## 2023-12-08 MED ORDER — ONDANSETRON HCL 4 MG/2ML IJ SOLN
4.0000 mg | Freq: Once | INTRAMUSCULAR | Status: AC
  Administered 2023-12-08: 4 mg via INTRAVENOUS
  Filled 2023-12-08: qty 2

## 2023-12-08 MED ORDER — ONDANSETRON 4 MG PO TBDP: 4.0000 mg | ORAL_TABLET | Freq: Once | ORAL | Status: DC | PRN

## 2023-12-08 NOTE — ED Notes (Signed)
Reviewed discharge instructions, medications, and home care with pt. Pt verbalized understanding and had no further questions. Pt exited ED without complications.

## 2023-12-08 NOTE — ED Provider Notes (Signed)
 Emergency Department Provider Note   I have reviewed the triage vital signs and the nursing notes.   HISTORY  Chief Complaint Emesis   HPI Jennifer Gonzales is a 43 y.o. female with past history reviewed below presents to the emergency department with nausea, vomiting in the setting of increasing her dose of Wegovy .  She states due to insurance and pharmacy issues she was without the medicine for 2 weeks.  She restarted yesterday but at an increased dose compared to her prior.  Denies abdominal pain.  She denies diarrhea.  She is attempted to keep down fluids without much relief in symptoms.   Past Medical History:  Diagnosis Date   Back pain    BV (bacterial vaginosis)    Cervical high risk human papillomavirus (HPV) DNA test positive 12/05/2016   Fibromyalgia    Hx of migraines    Hypertension    Mental disorder    Papanicolaou smear of cervix with positive high risk human papilloma virus (HPV) test 04/02/2019   03/2019, +HPV 18 needs colpo___   Vaginal Pap smear, abnormal     Review of Systems  Constitutional: No fever/chills Cardiovascular: Denies chest pain. Respiratory: Denies shortness of breath. Gastrointestinal: No abdominal pain. Positive nausea and vomiting.  No diarrhea.  No constipation. Genitourinary: Negative for dysuria. Musculoskeletal: Negative for back pain. Skin: Negative for rash. Neurological: Negative for headaches.   ____________________________________________   PHYSICAL EXAM:  VITAL SIGNS: ED Triage Vitals  Encounter Vitals Group     BP 12/08/23 1908 (!) 165/108     Pulse Rate 12/08/23 1908 (!) 105     Resp 12/08/23 1908 16     Temp 12/08/23 1908 98.4 F (36.9 C)     Temp Source 12/08/23 1908 Oral     SpO2 12/08/23 1908 95 %   Constitutional: Alert and oriented. Well appearing and in no acute distress. Eyes: Conjunctivae are normal.  Head: Atraumatic. Nose: No congestion/rhinnorhea. Mouth/Throat: Mucous membranes are moist.   Neck: No stridor.   Cardiovascular: Normal rate, regular rhythm. Good peripheral circulation. Grossly normal heart sounds.   Respiratory: Normal respiratory effort.  No retractions. Lungs CTAB. Gastrointestinal: Soft and nontender. No distention.  Musculoskeletal: No gross deformities of extremities. Neurologic:  Normal speech and language.  Skin:  Skin is warm, dry and intact. No rash noted.  ____________________________________________   LABS (all labs ordered are listed, but only abnormal results are displayed)  Labs Reviewed  COMPREHENSIVE METABOLIC PANEL WITH GFR - Abnormal; Notable for the following components:      Result Value   Glucose, Bld 117 (*)    All other components within normal limits  CBC - Abnormal; Notable for the following components:   WBC 13.1 (*)    Hemoglobin 16.1 (*)    HCT 47.9 (*)    All other components within normal limits  LIPASE, BLOOD  HCG, SERUM, QUALITATIVE   ____________________________________________   PROCEDURES  Procedure(s) performed:   Procedures  None  ____________________________________________   INITIAL IMPRESSION / ASSESSMENT AND PLAN / ED COURSE  Pertinent labs & imaging results that were available during my care of the patient were reviewed by me and considered in my medical decision making (see chart for details).   This patient is Presenting for Evaluation of palpitations, which does require a range of treatment options, and is a complaint that involves a high risk of morbidity and mortality.  The Differential Diagnoses include medication reaction, ileus, pancreatitis, cholecystitis, etc.  Critical Interventions-  Medications  sodium chloride  0.9 % bolus 1,000 mL (0 mLs Intravenous Stopped 12/08/23 2119)  ondansetron  (ZOFRAN ) injection 4 mg (4 mg Intravenous Given 12/08/23 2006)    Reassessment after intervention: Symptoms improved.   Clinical Laboratory Tests Ordered, included CBC with mild leukocytosis.   Pregnancy negative.  LFTs, bilirubin, lipase negative  Cardiac Monitor Tracing which shows sinus tachycardia.    Social Determinants of Health Risk patient has a smoking history.   Medical Decision Making: Summary:  The patient presents emergency department with nausea and vomiting.  Symptoms likely related to increasing her dose of Wegovy  after a 2-week break.  Nontender abdomen.  Doubt ileus or obstruction.  Do not plan for abdominal imaging at this time.  Reevaluation with update and discussion with patient.  She is feeling much better after IV fluids and Zofran .  Lab work is reassuring.  Stable for discharge with Zofran .    Patient's presentation is most consistent with acute presentation with potential threat to life or bodily function.   Disposition: discharge  ____________________________________________  FINAL CLINICAL IMPRESSION(S) / ED DIAGNOSES  Final diagnoses:  Nausea and vomiting, unspecified vomiting type     NEW OUTPATIENT MEDICATIONS STARTED DURING THIS VISIT:  Discharge Medication List as of 12/08/2023  9:24 PM     START taking these medications   Details  ondansetron  (ZOFRAN -ODT) 4 MG disintegrating tablet Take 1 tablet (4 mg total) by mouth every 8 (eight) hours as needed for nausea or vomiting., Starting Fri 12/08/2023, Normal        Note:  This document was prepared using Dragon voice recognition software and may include unintentional dictation errors.  Fonda Law, MD, [facility] Emergency [facility]    Otila Starn, Fonda MATSU, MD 12/08/23 2312

## 2023-12-08 NOTE — Discharge Instructions (Signed)
You have been seen in the Emergency Department (ED) today for nausea and vomiting.  Your work up today has not shown a clear cause for your symptoms. °You have been prescribed Zofran; please use as prescribed as needed for your nausea. ° °Follow up with your doctor as soon as possible regarding today?s emergent visit and your symptoms of nausea.  ° °Return to the ED if you develop abdominal, bloody vomiting, bloody diarrhea, if you are unable to tolerate fluids due to vomiting, or if you develop other symptoms that concern you. ° °

## 2023-12-08 NOTE — ED Notes (Signed)
 ED Provider at bedside.

## 2023-12-08 NOTE — ED Triage Notes (Signed)
 Pt complaints of vomiting started last night. Pt report vomiting started 4hr after Wegovy  injection. Pt report having emesis every 30 minutes.  Denies pain, alert and oriented at triage.

## 2023-12-29 ENCOUNTER — Encounter: Payer: Self-pay | Admitting: Internal Medicine

## 2023-12-29 ENCOUNTER — Ambulatory Visit (INDEPENDENT_AMBULATORY_CARE_PROVIDER_SITE_OTHER): Admitting: Internal Medicine

## 2023-12-29 ENCOUNTER — Other Ambulatory Visit (HOSPITAL_BASED_OUTPATIENT_CLINIC_OR_DEPARTMENT_OTHER): Payer: Self-pay

## 2023-12-29 ENCOUNTER — Other Ambulatory Visit (HOSPITAL_COMMUNITY): Payer: Self-pay

## 2023-12-29 DIAGNOSIS — G8929 Other chronic pain: Secondary | ICD-10-CM

## 2023-12-29 DIAGNOSIS — M25562 Pain in left knee: Secondary | ICD-10-CM

## 2023-12-29 DIAGNOSIS — R110 Nausea: Secondary | ICD-10-CM

## 2023-12-29 DIAGNOSIS — I10 Essential (primary) hypertension: Secondary | ICD-10-CM

## 2023-12-29 DIAGNOSIS — M25561 Pain in right knee: Secondary | ICD-10-CM

## 2023-12-29 DIAGNOSIS — R7303 Prediabetes: Secondary | ICD-10-CM | POA: Insufficient documentation

## 2023-12-29 DIAGNOSIS — D72829 Elevated white blood cell count, unspecified: Secondary | ICD-10-CM | POA: Insufficient documentation

## 2023-12-29 MED ORDER — WEGOVY 2.4 MG/0.75ML ~~LOC~~ SOAJ
2.4000 mg | SUBCUTANEOUS | 2 refills | Status: DC
  Filled 2023-12-29: qty 3, 28d supply, fill #0

## 2023-12-29 MED ORDER — ONDANSETRON 8 MG PO TBDP: 8.0000 mg | ORAL_TABLET | Freq: Three times a day (TID) | ORAL | 0 refills | Status: AC | PRN

## 2023-12-29 MED ORDER — WEGOVY 1.7 MG/0.75ML ~~LOC~~ SOAJ
1.7000 mg | SUBCUTANEOUS | 0 refills | Status: DC
  Filled 2023-12-29 – 2024-01-03 (×3): qty 3, 28d supply, fill #0

## 2023-12-29 NOTE — Patient Instructions (Signed)
 Please start taking Wegovy  1.7 mg after completing 1 mg doses.  Please take Zofran  as needed for nausea.  Please continue to take medications as prescribed.  Please continue to follow low carb diet and perform moderate exercise/walking at least 150 mins/week.  Please get fasting blood tests done before the next visit.

## 2023-12-29 NOTE — Assessment & Plan Note (Signed)
 BP Readings from Last 1 Encounters:  12/29/23 136/82   Well-controlled with losartan  12.5 mg QD now Counseled for compliance with the medications Advised DASH diet and moderate exercise/walking, at least 150 mins/week

## 2023-12-29 NOTE — Progress Notes (Signed)
 Established Patient Office Visit  Subjective:  Patient ID: Jennifer Gonzales, female    DOB: 1981/03/10  Age: 43 y.o. MRN: [id number]  CC:  Chief Complaint  Patient presents with   Hypertension    3 month f/u    Obesity    3 month f/u     HPI Jennifer Gonzales is a 43 y.o. female with past medical history of HTN, migraine and morbid obesity who presents for f/u of her chronic medical conditions.  HTN: Her BP is WNL today. She takes losartan  12.5 mg QD currently.  Denies any chest pain, dyspnea or palpitations currently.  Fibromyalgia: She reports history of fibromyalgia and has responded well to topiramate  50 mg QD.  History of migraine: She has longstanding history of migraine, usually gets about 3 episodes in a month with nausea, photophobia, phonophobia.  She used to take topiramate , which had helped her with frequency and intensity of migraine.  She usually goes in a dark room and puts ice bags around her head to get relief.  Chronic left knee pain: Has been evaluated by orthopedic surgeon. She has completed physical therapy. She has noticed the right knee is starting to bother her more recently as she has been putting more pressure on right knee to avoid left-sided knee pain. She was doing a lot better until 06/19/23, when she ran for a medical emergency at work.  She is currently taking meloxicam  15 mg QOD alternating with Tylenol .  Morbid obesity: She has lost 20 lbs with Wegovy . She is tolerating Wegovy  1 mg qw now. Had an ER visit for severe nausea and vomiting when she increased dose to 1 mg, but reports that she could not take Wegovy  for 2 weeks due to pharmacy issues before increasing dose to 1 mg. She has been following low-carb diet for the last 6 months.  She has cut down salt drink intake and red meat.  She is trying to increase physical activity as well.  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/05/2016   Fibromyalgia    Hx of migraines    Hypertension    Mental disorder    Papanicolaou smear of cervix with positive high risk human papilloma virus (HPV) test 04/02/2019   03/2019, +HPV 18 needs colpo___   Vaginal Pap smear, abnormal     Past Surgical History:  Procedure Laterality Date   BACK SURGERY     CESAREAN SECTION     CHOLECYSTECTOMY     TONSILLECTOMY      Family History  Problem Relation Age of Onset   Hypertension Other    Diabetes Other    Stroke Other    Heart disease Other    Cancer Paternal Grandmother        breast   Diabetes Father    Muscular dystrophy Mother    Muscular dystrophy Brother    Muscular dystrophy Sister    Muscular dystrophy Sister    Asthma Son    Other Son        allergies    Social History   Socioeconomic History   Marital status: Divorced    Spouse name: Not on file   Number of children: 2   Years of education: Not on file   Highest education level: Not on file  Occupational History   Not on file  Tobacco Use  Smoking status: Every Day    Current packs/day: 0.25    Average packs/day: 0.3 packs/day for 23.0 years (5.8 ttl pk-yrs)    Types: Cigarettes   Smokeless tobacco: Never  Vaping Use   Vaping status: Former  Substance and Sexual Activity   Alcohol use: Not Currently   Drug use: No   Sexual activity: Not Currently    Birth control/protection: Injection  Other Topics Concern   Not on file  Social History Narrative   Not on file   Social Drivers of Health   Financial Resource Strain: Low Risk  (08/11/2023)   Overall Financial Resource Strain (CARDIA)    Difficulty of Paying Living Expenses: Not hard at all  Food Insecurity: No Food Insecurity (08/11/2023)   Hunger Vital Sign    Worried About Running Out of Food in the Last Year: Never true    Ran Out of Food in the Last Year: Never true  Transportation Needs: No Transportation Needs (08/11/2023)   PRAPARE -  Administrator, Civil Service (Medical): No    Lack of Transportation (Non-Medical): No  Physical Activity: Insufficiently Active (08/11/2023)   Exercise Vital Sign    Days of Exercise per Week: 2 days    Minutes of Exercise per Session: 20 min  Stress: Stress Concern Present (08/11/2023)   [employer] of Occupational Health - Occupational Stress Questionnaire    Feeling of Stress : To some extent  Social Connections: Socially Isolated (08/11/2023)   Social Connection and Isolation Panel    Frequency of Communication with Friends and Family: More than three times a week    Frequency of Social Gatherings with Friends and Family: More than three times a week    Attends Religious Services: Never    Database administrator or Organizations: No    Attends Banker Meetings: Never    Marital Status: Divorced  Catering manager Violence: Not At Risk (08/11/2023)   Humiliation, Afraid, Rape, and Kick questionnaire    Fear of Current or Ex-Partner: No    Emotionally Abused: No    Physically Abused: No    Sexually Abused: No    ROS Review of Systems  Constitutional:  Negative for chills and fever.  HENT:  Negative for congestion, sinus pressure, sinus pain and sore throat.   Eyes:  Negative for pain and discharge.  Respiratory:  Negative for cough and shortness of breath.   Cardiovascular:  Negative for chest pain and palpitations.  Gastrointestinal:  Positive for diarrhea. Negative for abdominal pain, nausea and vomiting.  Endocrine: Negative for polydipsia and polyuria.  Genitourinary:  Negative for dysuria and hematuria.  Musculoskeletal:  Positive for arthralgias (B/l knee) and myalgias. Negative for neck pain and neck stiffness.  Skin:  Negative for rash.  Neurological:  Negative for dizziness and weakness.  Psychiatric/Behavioral:  Positive for sleep disturbance. Negative for agitation and behavioral problems.     Objective:   Today's Vitals: BP 136/82  (BP Location: Left Arm, Patient Position: Sitting, Cuff Size: Normal)   Pulse 85   Ht 4' 11.5 (1.511 m)   Wt 254 lb (115.2 kg)   SpO2 94%   BMI 50.44 kg/m   Physical Exam Vitals reviewed.  Constitutional:      General: She is not in acute distress.    Appearance: She is obese. She is not diaphoretic.  HENT:     Head: Normocephalic and atraumatic.     Nose: Nose normal.     Mouth/Throat:  Mouth: Mucous membranes are moist.  Eyes:     General: No scleral icterus.    Extraocular Movements: Extraocular movements intact.  Cardiovascular:     Rate and Rhythm: Normal rate and regular rhythm.     Heart sounds: Normal heart sounds. No murmur heard. Pulmonary:     Breath sounds: Normal breath sounds. No wheezing or rales.  Musculoskeletal:     Cervical back: Neck supple. No tenderness.     Right lower leg: No edema.     Left lower leg: No edema.  Skin:    General: Skin is warm.     Findings: No rash.  Neurological:     General: No focal deficit present.     Mental Status: She is alert and oriented to person, place, and time.  Psychiatric:        Mood and Affect: Mood normal.        Behavior: Behavior normal.     Assessment & Plan:   Problem List Items Addressed This Visit       Cardiovascular and Mediastinum   Essential hypertension   BP Readings from Last 1 Encounters:  12/29/23 136/82   Well-controlled with losartan  12.5 mg QD now Counseled for compliance with the medications Advised DASH diet and moderate exercise/walking, at least 150 mins/week      Relevant Orders   CMP14+EGFR   CBC with Differential/Platelet     Other   Morbid obesity (HCC) - Primary   BMI Readings from Last 3 Encounters:  12/29/23 50.44 kg/m  09/27/23 54.49 kg/m  08/11/23 53.42 kg/m   Advised to follow DASH diet and perform moderate exercise/walking at least 150 minutes/week Was unable to lose weight with low-carb diet alone Discussed about medical weight loss options,  including benefits and side effects Has lost 20 lbs with Wegovy  On Wegovy -initial BMI: 54.49, increase dose as tolerated - increased to 1.7 mg for 4 weeks and then 2.4 mg qw      Relevant Medications   semaglutide -weight management (WEGOVY ) 1.7 MG/0.75ML SOAJ SQ injection   semaglutide -weight management (WEGOVY ) 2.4 MG/0.75ML SOAJ SQ injection (Start on 01/26/2024)   Chronic pain of both knees   Previous x-ray of left knee showed degenerative changes She reports that she had an MRI of knee as well, which showed ligament injury and bursitis Takes meloxicam  15 mg QD for pain, refilled - advised to alternate with Tylenol  arthritis Followed by orthopedic surgery-has had intra articular steroid injection, has had PT      Leukocytosis   Recent CBC during ER visit showed leukocytosis, likely reactive leukocytosis due to her severe nausea and vomiting Will recheck CBC later      Prediabetes   Lab Results  Component Value Date   HGBA1C 6.2 (H) 09/28/2023   Advised to follow low-carb diet On Wegovy  for weight loss      Relevant Orders   CMP14+EGFR   Hemoglobin A1c   Other Visit Diagnoses       Nausea       Relevant Medications   ondansetron  (ZOFRAN -ODT) 8 MG disintegrating tablet        Outpatient Encounter Medications as of 12/29/2023  Medication Sig   Cetirizine HCl (ZYRTEC PO) Take by mouth.   lansoprazole  (PREVACID ) 30 MG capsule Take 1 capsule (30 mg total) by mouth daily at 12 noon.   losartan  (COZAAR ) 25 MG tablet TAKE 1 TABLET(25 MG) BY MOUTH DAILY   medroxyPROGESTERone  (DEPO-PROVERA ) 150 MG/ML injection Inject 1 mL (150 mg total) into  the muscle every 3 (three) months.   meloxicam  (MOBIC ) 15 MG tablet Take 1 tablet (15 mg total) by mouth daily.   rizatriptan  (MAXALT ) 10 MG tablet Take 1 tablet (10 mg total) by mouth as needed for migraine. May repeat in 2 hours if needed   semaglutide -weight management (WEGOVY ) 1.7 MG/0.75ML SOAJ SQ injection Inject 1.7 mg into the  skin every 7 (seven) days.   [START ON 01/26/2024] semaglutide -weight management (WEGOVY ) 2.4 MG/0.75ML SOAJ SQ injection Inject 2.4 mg into the skin every 7 (seven) days.   topiramate  (TOPAMAX ) 50 MG tablet Take 1 tablet (50 mg total) by mouth daily.   [DISCONTINUED] ondansetron  (ZOFRAN -ODT) 4 MG disintegrating tablet Take 1 tablet (4 mg total) by mouth every 8 (eight) hours as needed for nausea or vomiting.   ondansetron  (ZOFRAN -ODT) 8 MG disintegrating tablet Take 1 tablet (8 mg total) by mouth every 8 (eight) hours as needed for nausea or vomiting.   No facility-administered encounter medications on file as of 12/29/2023.    Follow-up: Return in about 3 months (around 03/30/2024) for Weight management.   Suzzane MARLA Blanch, MD

## 2023-12-29 NOTE — Assessment & Plan Note (Addendum)
 Previous x-ray of left knee showed degenerative changes She reports that she had an MRI of knee as well, which showed ligament injury and bursitis Takes meloxicam  15 mg QD for pain, refilled - advised to alternate with Tylenol  arthritis Followed by orthopedic surgery-has had intra articular steroid injection, has had PT

## 2023-12-29 NOTE — Assessment & Plan Note (Signed)
 BMI Readings from Last 3 Encounters:  12/29/23 50.44 kg/m  09/27/23 54.49 kg/m  08/11/23 53.42 kg/m   Advised to follow DASH diet and perform moderate exercise/walking at least 150 minutes/week Was unable to lose weight with low-carb diet alone Discussed about medical weight loss options, including benefits and side effects Has lost 20 lbs with Wegovy On Wegovy-initial BMI: 54.49, increase dose as tolerated - increased to 1.7 mg for 4 weeks and then 2.4 mg qw

## 2023-12-29 NOTE — Assessment & Plan Note (Signed)
 Recent CBC during ER visit showed leukocytosis, likely reactive leukocytosis due to her severe nausea and vomiting Will recheck CBC later

## 2023-12-29 NOTE — Assessment & Plan Note (Signed)
 Lab Results  Component Value Date   HGBA1C 6.2 (H) 09/28/2023   Advised to follow low-carb diet On Wegovy  for weight loss

## 2024-01-03 ENCOUNTER — Other Ambulatory Visit (HOSPITAL_COMMUNITY): Payer: Self-pay

## 2024-01-03 ENCOUNTER — Other Ambulatory Visit (HOSPITAL_BASED_OUTPATIENT_CLINIC_OR_DEPARTMENT_OTHER): Payer: Self-pay

## 2024-01-05 ENCOUNTER — Other Ambulatory Visit (HOSPITAL_COMMUNITY): Payer: Self-pay

## 2024-01-31 ENCOUNTER — Ambulatory Visit: Admitting: *Deleted

## 2024-01-31 DIAGNOSIS — Z3042 Encounter for surveillance of injectable contraceptive: Secondary | ICD-10-CM | POA: Diagnosis not present

## 2024-01-31 MED ORDER — MEDROXYPROGESTERONE ACETATE 150 MG/ML IM SUSP
150.0000 mg | Freq: Once | INTRAMUSCULAR | Status: AC
  Administered 2024-01-31: 150 mg via INTRAMUSCULAR

## 2024-01-31 NOTE — Progress Notes (Signed)
   NURSE VISIT- INJECTION  SUBJECTIVE:  Jennifer Gonzales is a 43 y.o. [id number] female here for a Depo Provera  for contraception/period management. She is a GYN patient.   OBJECTIVE:  There were no vitals taken for this visit.  Appears well, in no apparent distress  Injection administered in: Right deltoid  Meds ordered this encounter  Medications   medroxyPROGESTERone  (DEPO-PROVERA ) injection 150 mg    ASSESSMENT: GYN patient Depo Provera  for contraception/period management PLAN: Follow-up: in 11-13 weeks for next Depo   Alan LITTIE Fischer  01/31/2024 9:51 AM

## 2024-03-02 ENCOUNTER — Other Ambulatory Visit: Payer: Self-pay | Admitting: Internal Medicine

## 2024-03-02 DIAGNOSIS — K219 Gastro-esophageal reflux disease without esophagitis: Secondary | ICD-10-CM

## 2024-03-26 ENCOUNTER — Other Ambulatory Visit (HOSPITAL_COMMUNITY): Payer: Self-pay

## 2024-03-27 ENCOUNTER — Other Ambulatory Visit (HOSPITAL_COMMUNITY): Payer: Self-pay

## 2024-03-28 ENCOUNTER — Ambulatory Visit: Admitting: Internal Medicine

## 2024-04-24 ENCOUNTER — Ambulatory Visit

## 2024-04-24 DIAGNOSIS — Z3042 Encounter for surveillance of injectable contraceptive: Secondary | ICD-10-CM

## 2024-04-24 MED ORDER — MEDROXYPROGESTERONE ACETATE 150 MG/ML IM SUSP
150.0000 mg | Freq: Once | INTRAMUSCULAR | Status: AC
  Administered 2024-04-24: 150 mg via INTRAMUSCULAR

## 2024-04-24 NOTE — Progress Notes (Signed)
° °  NURSE VISIT- INJECTION  SUBJECTIVE:  Jennifer Gonzales is a 43 y.o. [id number] female here for a Depo Provera  for contraception/period management. She is a GYN patient.   OBJECTIVE:  There were no vitals taken for this visit.  Appears well, in no apparent distress  Injection administered in: Left deltoid  Meds ordered this encounter  Medications   medroxyPROGESTERone  (DEPO-PROVERA ) injection 150 mg    ASSESSMENT: GYN patient Depo Provera  for contraception/period management PLAN: Follow-up: in 11-13 weeks for next Depo   Aleck FORBES Blase  04/24/2024 10:23 AM

## 2024-05-22 ENCOUNTER — Telehealth (HOSPITAL_BASED_OUTPATIENT_CLINIC_OR_DEPARTMENT_OTHER): Payer: Self-pay | Admitting: Pharmacist

## 2024-05-22 ENCOUNTER — Ambulatory Visit (INDEPENDENT_AMBULATORY_CARE_PROVIDER_SITE_OTHER): Admitting: Internal Medicine

## 2024-05-22 ENCOUNTER — Other Ambulatory Visit (HOSPITAL_BASED_OUTPATIENT_CLINIC_OR_DEPARTMENT_OTHER): Payer: Self-pay

## 2024-05-22 ENCOUNTER — Encounter: Payer: Self-pay | Admitting: Internal Medicine

## 2024-05-22 VITALS — BP 146/88 | HR 91 | Ht 59.5 in | Wt 258.2 lb

## 2024-05-22 DIAGNOSIS — E782 Mixed hyperlipidemia: Secondary | ICD-10-CM | POA: Diagnosis not present

## 2024-05-22 DIAGNOSIS — I10 Essential (primary) hypertension: Secondary | ICD-10-CM

## 2024-05-22 DIAGNOSIS — R739 Hyperglycemia, unspecified: Secondary | ICD-10-CM

## 2024-05-22 DIAGNOSIS — K219 Gastro-esophageal reflux disease without esophagitis: Secondary | ICD-10-CM | POA: Diagnosis not present

## 2024-05-22 DIAGNOSIS — R7303 Prediabetes: Secondary | ICD-10-CM

## 2024-05-22 DIAGNOSIS — M797 Fibromyalgia: Secondary | ICD-10-CM | POA: Diagnosis not present

## 2024-05-22 DIAGNOSIS — G43E19 Chronic migraine with aura, intractable, without status migrainosus: Secondary | ICD-10-CM

## 2024-05-22 DIAGNOSIS — E559 Vitamin D deficiency, unspecified: Secondary | ICD-10-CM

## 2024-05-22 MED ORDER — WEGOVY 1 MG/0.5ML ~~LOC~~ SOAJ
1.0000 mg | SUBCUTANEOUS | 0 refills | Status: AC
  Filled 2024-05-22 – 2024-05-24 (×2): qty 2, 28d supply, fill #0

## 2024-05-22 MED ORDER — TOPIRAMATE 50 MG PO TABS
50.0000 mg | ORAL_TABLET | Freq: Every day | ORAL | 3 refills | Status: AC
  Filled 2024-05-22: qty 90, 90d supply, fill #0

## 2024-05-22 MED ORDER — WEGOVY 0.5 MG/0.5ML ~~LOC~~ SOAJ
0.5000 mg | SUBCUTANEOUS | 0 refills | Status: AC
  Filled 2024-05-22: qty 2, 28d supply, fill #0

## 2024-05-22 MED ORDER — AMLODIPINE BESYLATE 5 MG PO TABS
5.0000 mg | ORAL_TABLET | Freq: Every day | ORAL | 1 refills | Status: AC
  Filled 2024-05-22: qty 90, 90d supply, fill #0

## 2024-05-22 NOTE — Assessment & Plan Note (Addendum)
 Well-controlled with topiramate  50 mg once daily, needs to continue to get refills regularly Maxalt  as needed for breakthrough migraine, has tried Imitrex in the past without much relief

## 2024-05-22 NOTE — Patient Instructions (Addendum)
 Please start taking Wegovy  0.5 mg once weekly for 4 weeks and then increase to 1 mg dose.  Please start taking Amlodipine  5 mg once daily instead of Losartan .  Please continue to take medications as prescribed.  Please continue to follow low carb diet and perform moderate exercise/walking at least 150 mins/week.

## 2024-05-22 NOTE — Assessment & Plan Note (Signed)
 Check lipid profile Advised to follow DASH diet, material provided

## 2024-05-22 NOTE — Assessment & Plan Note (Signed)
 BMI Readings from Last 3 Encounters:  05/22/24 51.28 kg/m  12/29/23 50.44 kg/m  09/27/23 54.49 kg/m   Advised to follow DASH diet and perform moderate exercise/walking at least 150 minutes/week Was unable to lose weight with low-carb diet alone Discussed about medical weight loss options, including benefits and side effects Had lost 35 lbs with Wegovy , but has gained about 18 lbs since stopping it On Wegovy -initial BMI: 54.49, did not tolerate 1.7 mg dose, restart at 0.5 mg dose

## 2024-05-22 NOTE — Assessment & Plan Note (Addendum)
 BP Readings from Last 1 Encounters:  05/22/24 (!) 146/88   Uncontrolled  DC losartan  12.5 mg QD due to dizzy spells, likely due to orthostatic hypotension Switched to amlodipine  5 mg QD Counseled for compliance with the medications Advised DASH diet and moderate exercise/walking, at least 150 mins/week

## 2024-05-22 NOTE — Assessment & Plan Note (Signed)
 Has chronic myalgias, reports history of fibromyalgia Was responding well to topiramate  50 mg QD, refilled

## 2024-05-22 NOTE — Assessment & Plan Note (Signed)
 Well controlled with Prevacid  30 mg QD

## 2024-05-22 NOTE — Telephone Encounter (Signed)
 Medication: wegovy  0.5mg /0.71ml Able to fill? No Prior authorization required? Yes Co-pay before assistance: na

## 2024-05-22 NOTE — Progress Notes (Signed)
 "  Established Patient Office Visit  Subjective:  Patient ID: Jennifer Gonzales, female    DOB: 15-Mar-1981  Age: 44 y.o. MRN: [id number]  CC:  Chief Complaint  Patient presents with   Hypertension   Obesity    HPI Jennifer Gonzales is a 44 y.o. female with past medical history of HTN, migraine and morbid obesity who presents for f/u of her chronic medical conditions.  HTN: Her BP is elevated today. She has stopped taking losartan  12.5 mg QD currently as she was having dizzy spells.  Denies any chest pain, dyspnea or palpitations currently.  Fibromyalgia: She reports history of fibromyalgia and has responded well to topiramate  50 mg QD.  History of migraine: She has longstanding history of migraine, usually gets about 3 episodes in a month with nausea, photophobia, phonophobia.  She used to take topiramate , which had helped her with frequency and intensity of migraine, but has not received refills from pharmacy recently.  She usually goes in a dark room and puts ice bags around her head to get relief.  Chronic left knee pain: Has been evaluated by orthopedic surgeon. She has completed physical therapy. She has noticed the right knee is starting to bother her more recently as she has been putting more pressure on right knee to avoid left-sided knee pain. She was doing a lot better until 06/19/23, when she ran for a medical emergency at work.  She is currently taking meloxicam  15 mg QOD alternating with Tylenol .  Morbid obesity: She had lost 20 lbs with Wegovy . She has stopped Wegovy  now as she had severe nausea and vomiting with 1.7 mg dose. Had an ER visit for severe nausea and vomiting when she increased dose to 1 mg, but reports that she could not take Wegovy  for 2 weeks due to pharmacy issues before increasing dose to 1 mg. She has been following low-carb diet for the last 1 year.  She has cut down salt drink intake and red meat.  She is trying to increase physical activity as well.  Past  Medical History:  Diagnosis Date   Back pain    BV (bacterial vaginosis)    Cervical high risk human papillomavirus (HPV) DNA test positive 12/05/2016   Fibromyalgia    Hx of migraines    Hypertension    Mental disorder    Papanicolaou smear of cervix with positive high risk human papilloma virus (HPV) test 04/02/2019   03/2019, +HPV 18 needs colpo___   Vaginal Pap smear, abnormal     Past Surgical History:  Procedure Laterality Date   BACK SURGERY     CESAREAN SECTION     CHOLECYSTECTOMY     TONSILLECTOMY      Family History  Problem Relation Age of Onset   Hypertension Other    Diabetes Other    Stroke Other    Heart disease Other    Cancer Paternal Grandmother        breast   Diabetes Father    Muscular dystrophy Mother    Muscular dystrophy Brother    Muscular dystrophy Sister    Muscular dystrophy Sister    Asthma Son    Other Son        allergies    Social History   Socioeconomic History   Marital status: Divorced    Spouse name: Not on file   Number of children: 2   Years of education: Not on file   Highest education level: Not on file  Occupational  History   Not on file  Tobacco Use   Smoking status: Every Day    Current packs/day: 0.25    Average packs/day: 0.3 packs/day for 23.0 years (5.8 ttl pk-yrs)    Types: Cigarettes   Smokeless tobacco: Never  Vaping Use   Vaping status: Former  Substance and Sexual Activity   Alcohol use: Not Currently   Drug use: No   Sexual activity: Not Currently    Birth control/protection: Injection  Other Topics Concern   Not on file  Social History Narrative   Not on file   Social Drivers of Health   Tobacco Use: High Risk (05/22/2024)   Patient History    Smoking Tobacco Use: Every Day    Smokeless Tobacco Use: Never    Passive Exposure: Not on file  Financial Resource Strain: Low Risk (08/11/2023)   Overall Financial Resource Strain (CARDIA)    Difficulty of Paying Living Expenses: Not hard at all   Food Insecurity: No Food Insecurity (08/11/2023)   Hunger Vital Sign    Worried About Running Out of Food in the Last Year: Never true    Ran Out of Food in the Last Year: Never true  Transportation Needs: No Transportation Needs (08/11/2023)   PRAPARE - Administrator, Civil Service (Medical): No    Lack of Transportation (Non-Medical): No  Physical Activity: Insufficiently Active (08/11/2023)   Exercise Vital Sign    Days of Exercise per Week: 2 days    Minutes of Exercise per Session: 20 min  Stress: Stress Concern Present (08/11/2023)   [employer] of Occupational Health - Occupational Stress Questionnaire    Feeling of Stress : To some extent  Social Connections: Socially Isolated (08/11/2023)   Social Connection and Isolation Panel    Frequency of Communication with Friends and Family: More than three times a week    Frequency of Social Gatherings with Friends and Family: More than three times a week    Attends Religious Services: Never    Database Administrator or Organizations: No    Attends Banker Meetings: Never    Marital Status: Divorced  Catering Manager Violence: Not At Risk (08/11/2023)   Humiliation, Afraid, Rape, and Kick questionnaire    Fear of Current or Ex-Partner: No    Emotionally Abused: No    Physically Abused: No    Sexually Abused: No  Depression (PHQ2-9): Medium Risk (05/22/2024)   Depression (PHQ2-9)    PHQ-2 Score: 7  Alcohol Screen: Low Risk (08/11/2023)   Alcohol Screen    Last Alcohol Screening Score (AUDIT): 2  Housing: Low Risk (08/11/2023)   Housing Stability Vital Sign    Unable to Pay for Housing in the Last Year: No    Number of Times Moved in the Last Year: 0    Homeless in the Last Year: No  Utilities: Not At Risk (08/11/2023)   [facility] Utilities    Threatened with loss of utilities: No  Health Literacy: Not on file    ROS Review of Systems  Constitutional:  Negative for chills and fever.  HENT:  Negative for  congestion, sinus pressure, sinus pain and sore throat.   Eyes:  Negative for pain and discharge.  Respiratory:  Negative for cough and shortness of breath.   Cardiovascular:  Negative for chest pain and palpitations.  Gastrointestinal:  Negative for abdominal pain, diarrhea, nausea and vomiting.  Endocrine: Negative for polydipsia and polyuria.  Genitourinary:  Negative for dysuria and  hematuria.  Musculoskeletal:  Positive for arthralgias (B/l knee) and myalgias. Negative for neck pain and neck stiffness.  Skin:  Negative for rash.  Neurological:  Positive for headaches. Negative for dizziness and weakness.  Psychiatric/Behavioral:  Positive for sleep disturbance. Negative for agitation and behavioral problems.     Objective:   Today's Vitals: BP (!) 146/88 (BP Location: Left Arm)   Pulse 91   Ht 4' 11.5 (1.511 m)   Wt 258 lb 3.2 oz (117.1 kg)   SpO2 93%   BMI 51.28 kg/m   Physical Exam Vitals reviewed.  Constitutional:      General: She is not in acute distress.    Appearance: She is obese. She is not diaphoretic.  HENT:     Head: Normocephalic and atraumatic.     Nose: Nose normal.     Mouth/Throat:     Mouth: Mucous membranes are moist.  Eyes:     General: No scleral icterus.    Extraocular Movements: Extraocular movements intact.  Cardiovascular:     Rate and Rhythm: Normal rate and regular rhythm.     Heart sounds: Normal heart sounds. No murmur heard. Pulmonary:     Breath sounds: Normal breath sounds. No wheezing or rales.  Musculoskeletal:     Cervical back: Neck supple. No tenderness.     Right lower leg: No edema.     Left lower leg: No edema.  Skin:    General: Skin is warm.     Findings: No rash.  Neurological:     General: No focal deficit present.     Mental Status: She is alert and oriented to person, place, and time.  Psychiatric:        Mood and Affect: Mood normal.        Behavior: Behavior normal.     Assessment & Plan:   Problem List  Items Addressed This Visit       Cardiovascular and Mediastinum   Migraine   Well-controlled with topiramate  50 mg once daily, needs to continue to get refills regularly Maxalt  as needed for breakthrough migraine, has tried Imitrex in the past without much relief      Relevant Medications   topiramate  (TOPAMAX ) 50 MG tablet   amLODipine  (NORVASC ) 5 MG tablet   Other Relevant Orders   CMP14+EGFR   CBC with Differential/Platelet   Essential hypertension - Primary   BP Readings from Last 1 Encounters:  05/22/24 (!) 146/88   Uncontrolled  DC losartan  12.5 mg QD due to dizzy spells, likely due to orthostatic hypotension Switched to amlodipine  5 mg QD Counseled for compliance with the medications Advised DASH diet and moderate exercise/walking, at least 150 mins/week      Relevant Medications   amLODipine  (NORVASC ) 5 MG tablet   Other Relevant Orders   TSH   CMP14+EGFR   CBC with Differential/Platelet     Digestive   GERD (gastroesophageal reflux disease)   Well controlled with Prevacid  30 mg QD        Other   Fibromyalgia (Chronic)   Has chronic myalgias, reports history of fibromyalgia Was responding well to topiramate  50 mg QD, refilled      Relevant Medications   topiramate  (TOPAMAX ) 50 MG tablet   Other Relevant Orders   TSH   Hyperlipemia   Check lipid profile Advised to follow DASH diet, material provided      Relevant Medications   amLODipine  (NORVASC ) 5 MG tablet   Other Relevant Orders   Lipid  panel   Morbid obesity (HCC)   BMI Readings from Last 3 Encounters:  05/22/24 51.28 kg/m  12/29/23 50.44 kg/m  09/27/23 54.49 kg/m   Advised to follow DASH diet and perform moderate exercise/walking at least 150 minutes/week Was unable to lose weight with low-carb diet alone Discussed about medical weight loss options, including benefits and side effects Had lost 35 lbs with Wegovy , but has gained about 18 lbs since stopping it On Wegovy -initial BMI:  54.49, did not tolerate 1.7 mg dose, restart at 0.5 mg dose      Relevant Medications   semaglutide -weight management (WEGOVY ) 0.5 MG/0.5ML SOAJ SQ injection   semaglutide -weight management (WEGOVY ) 1 MG/0.5ML SOAJ SQ injection (Start on 06/17/2024)   Prediabetes   Lab Results  Component Value Date   HGBA1C 6.2 (H) 09/28/2023   Advised to follow low-carb diet Restart Wegovy  for weight loss      Other Visit Diagnoses       Hyperglycemia       Relevant Orders   Hemoglobin A1c     Vitamin D deficiency       Relevant Orders   VITAMIN D 25 Hydroxy (Vit-D Deficiency, Fractures)         Outpatient Encounter Medications as of 05/22/2024  Medication Sig   amLODipine  (NORVASC ) 5 MG tablet Take 1 tablet (5 mg total) by mouth daily.   Cetirizine HCl (ZYRTEC PO) Take by mouth.   lansoprazole  (PREVACID ) 30 MG capsule TAKE 1 CAPSULE BY MOUTH DAILY AT 12 NOON.   medroxyPROGESTERone  (DEPO-PROVERA ) 150 MG/ML injection Inject 1 mL (150 mg total) into the muscle every 3 (three) months.   meloxicam  (MOBIC ) 15 MG tablet Take 1 tablet (15 mg total) by mouth daily.   ondansetron  (ZOFRAN -ODT) 8 MG disintegrating tablet Take 1 tablet (8 mg total) by mouth every 8 (eight) hours as needed for nausea or vomiting.   rizatriptan  (MAXALT ) 10 MG tablet Take 1 tablet (10 mg total) by mouth as needed for migraine. May repeat in 2 hours if needed   semaglutide -weight management (WEGOVY ) 0.5 MG/0.5ML SOAJ SQ injection Inject 0.5 mg into the skin every 7 (seven) days.   [START ON 06/17/2024] semaglutide -weight management (WEGOVY ) 1 MG/0.5ML SOAJ SQ injection Inject 1 mg into the skin every 7 (seven) days.   [DISCONTINUED] losartan  (COZAAR ) 25 MG tablet TAKE 1 TABLET(25 MG) BY MOUTH DAILY   [DISCONTINUED] topiramate  (TOPAMAX ) 50 MG tablet Take 1 tablet (50 mg total) by mouth daily.   topiramate  (TOPAMAX ) 50 MG tablet Take 1 tablet (50 mg total) by mouth daily.   [DISCONTINUED] semaglutide -weight management (WEGOVY )  1.7 MG/0.75ML SOAJ SQ injection Inject 1.7 mg into the skin every 7 (seven) days.   [DISCONTINUED] semaglutide -weight management (WEGOVY ) 2.4 MG/0.75ML SOAJ SQ injection Inject 2.4 mg into the skin every 7 (seven) days.   No facility-administered encounter medications on file as of 05/22/2024.    Follow-up: Return in about 4 months (around 09/19/2024) for HTN and weight management.   Suzzane MARLA Blanch, MD "

## 2024-05-22 NOTE — Assessment & Plan Note (Addendum)
 Lab Results  Component Value Date   HGBA1C 6.2 (H) 09/28/2023   Advised to follow low-carb diet Restart Wegovy  for weight loss

## 2024-05-23 ENCOUNTER — Other Ambulatory Visit (HOSPITAL_BASED_OUTPATIENT_CLINIC_OR_DEPARTMENT_OTHER): Payer: Self-pay

## 2024-05-23 ENCOUNTER — Telehealth (HOSPITAL_BASED_OUTPATIENT_CLINIC_OR_DEPARTMENT_OTHER): Payer: Self-pay

## 2024-05-23 ENCOUNTER — Other Ambulatory Visit (HOSPITAL_COMMUNITY): Payer: Self-pay

## 2024-05-23 NOTE — Telephone Encounter (Signed)
 Pharmacy Patient Advocate Encounter   Received notification from Pt Calls Messages that prior authorization for Wegovy  0.5 mg/0.5 ml auto injectors is required/requested.   Insurance verification completed.   The patient is insured through [employer].   Per test claim: Per test claim, medication is not covered due to plan/benefit exclusion, PA not submitted at this time    *per the reject, patient has to enroll in [place]

## 2024-05-23 NOTE — Telephone Encounter (Signed)
 PA request has been Received. New Encounter has been or will be created for follow up. For additional info see Pharmacy Prior Auth telephone encounter from 05/23/24.

## 2024-05-24 ENCOUNTER — Other Ambulatory Visit (HOSPITAL_BASED_OUTPATIENT_CLINIC_OR_DEPARTMENT_OTHER): Payer: Self-pay

## 2024-05-27 ENCOUNTER — Other Ambulatory Visit (HOSPITAL_BASED_OUTPATIENT_CLINIC_OR_DEPARTMENT_OTHER): Payer: Self-pay

## 2024-05-28 ENCOUNTER — Other Ambulatory Visit (HOSPITAL_BASED_OUTPATIENT_CLINIC_OR_DEPARTMENT_OTHER): Payer: Self-pay

## 2024-05-29 ENCOUNTER — Other Ambulatory Visit (HOSPITAL_BASED_OUTPATIENT_CLINIC_OR_DEPARTMENT_OTHER): Payer: Self-pay

## 2024-05-30 ENCOUNTER — Other Ambulatory Visit (HOSPITAL_BASED_OUTPATIENT_CLINIC_OR_DEPARTMENT_OTHER): Payer: Self-pay

## 2024-05-31 ENCOUNTER — Other Ambulatory Visit (HOSPITAL_BASED_OUTPATIENT_CLINIC_OR_DEPARTMENT_OTHER): Payer: Self-pay

## 2024-06-03 ENCOUNTER — Other Ambulatory Visit (HOSPITAL_BASED_OUTPATIENT_CLINIC_OR_DEPARTMENT_OTHER): Payer: Self-pay

## 2024-06-04 ENCOUNTER — Other Ambulatory Visit (HOSPITAL_BASED_OUTPATIENT_CLINIC_OR_DEPARTMENT_OTHER): Payer: Self-pay

## 2024-06-05 ENCOUNTER — Other Ambulatory Visit (HOSPITAL_BASED_OUTPATIENT_CLINIC_OR_DEPARTMENT_OTHER): Payer: Self-pay

## 2024-06-10 ENCOUNTER — Other Ambulatory Visit (HOSPITAL_BASED_OUTPATIENT_CLINIC_OR_DEPARTMENT_OTHER): Payer: Self-pay

## 2024-06-11 ENCOUNTER — Other Ambulatory Visit (HOSPITAL_BASED_OUTPATIENT_CLINIC_OR_DEPARTMENT_OTHER): Payer: Self-pay

## 2024-06-12 ENCOUNTER — Other Ambulatory Visit (HOSPITAL_BASED_OUTPATIENT_CLINIC_OR_DEPARTMENT_OTHER): Payer: Self-pay

## 2024-06-13 ENCOUNTER — Other Ambulatory Visit (HOSPITAL_BASED_OUTPATIENT_CLINIC_OR_DEPARTMENT_OTHER): Payer: Self-pay

## 2024-06-14 ENCOUNTER — Other Ambulatory Visit (HOSPITAL_BASED_OUTPATIENT_CLINIC_OR_DEPARTMENT_OTHER): Payer: Self-pay

## 2024-06-17 ENCOUNTER — Other Ambulatory Visit (HOSPITAL_BASED_OUTPATIENT_CLINIC_OR_DEPARTMENT_OTHER): Payer: Self-pay

## 2024-06-18 ENCOUNTER — Other Ambulatory Visit (HOSPITAL_BASED_OUTPATIENT_CLINIC_OR_DEPARTMENT_OTHER): Payer: Self-pay

## 2024-06-19 ENCOUNTER — Other Ambulatory Visit: Payer: Self-pay

## 2024-06-19 ENCOUNTER — Other Ambulatory Visit (HOSPITAL_BASED_OUTPATIENT_CLINIC_OR_DEPARTMENT_OTHER): Payer: Self-pay

## 2024-06-19 ENCOUNTER — Encounter: Payer: Self-pay | Admitting: Emergency Medicine

## 2024-06-19 ENCOUNTER — Ambulatory Visit (INDEPENDENT_AMBULATORY_CARE_PROVIDER_SITE_OTHER)

## 2024-06-19 ENCOUNTER — Ambulatory Visit
Admission: EM | Admit: 2024-06-19 | Discharge: 2024-06-19 | Disposition: A | Discharge status: Home / Self Care | Source: Home / Self Care

## 2024-06-19 DIAGNOSIS — M25562 Pain in left knee: Secondary | ICD-10-CM

## 2024-06-19 DIAGNOSIS — W009XXA Unspecified fall due to ice and snow, initial encounter: Secondary | ICD-10-CM | POA: Diagnosis not present

## 2024-06-19 HISTORY — DX: Unspecified osteoarthritis, unspecified site: M19.90

## 2024-06-19 MED ORDER — BACLOFEN 10 MG PO TABS: 10.0000 mg | ORAL_TABLET | Freq: Three times a day (TID) | ORAL | 0 refills | Status: AC

## 2024-06-19 MED ORDER — PREDNISONE 20 MG PO TABS: 40.0000 mg | ORAL_TABLET | Freq: Every day | ORAL | 0 refills | Status: AC

## 2024-06-19 NOTE — ED Triage Notes (Addendum)
 Pt reports left knee pain since slipping in snow on Sunday. Pt reports went to grab on to car and LLE slipped underneath car. Reports LLE pain ever since. Pt able to bear weight but reports feels like my knee is going to give out. Pt reports has taken meloxicam  and ibuprofen  and reports pain is tolerable but remains.

## 2024-06-19 NOTE — ED Provider Notes (Signed)
 " [facility]    CSN: [id number] Arrival date & time: 06/19/24  1042      History   Chief Complaint Chief Complaint  Patient presents with   Knee Pain    HPI Jennifer Gonzales is a 44 y.o. female.   Jennifer Gonzales is a 44 y.o. female presenting for chief complaint of acute on chronic left knee pain after she fell 3 days ago on Sunday, June 16, 2024.  Patient was taking out the ice from underneath her car when she excellently slipped and fell.  Her left knee twisted during the fall and she landed onto her knees and hands.  She did not hit her head, did not pass out after the fall, and denies nausea/vomiting after falling.  History of chronic knee pain for which she has had multiple rounds of physical therapy and intra-articular injections.  Current left knee pain is localized to the medial portion of the left knee and is worsened by weightbearing activity/walking.  Pain is further exacerbated by walking up and down steps.  She denies popping/clicking to the left knee though she does state that the left knee feels like it is going to give out when she is walking.  She has been walking with a limp for the last 3 days favoring the left lower extremity.  Denies weakness/numbness and tingling to the left lower extremity.  Denies history of left knee surgery.  Taking Mobic  and ibuprofen  with minimal relief of symptoms.   Knee Pain   Past Medical History:  Diagnosis Date   Arthritis    Back pain    BV (bacterial vaginosis)    Cervical high risk human papillomavirus (HPV) DNA test positive 12/05/2016   Fibromyalgia    Hx of migraines    Hypertension    Mental disorder    Papanicolaou smear of cervix with positive high risk human papilloma virus (HPV) test 04/02/2019   03/2019, +HPV 18 needs colpo___   Vaginal Pap smear, abnormal     Patient Active Problem List   Diagnosis Date Noted   Leukocytosis 12/29/2023   Prediabetes 12/29/2023   ASCUS with positive high risk HPV  cervical 08/16/2023   History of abnormal cervical Pap smear 08/11/2023   Chronic pain of both knees 06/30/2023   Chronic diarrhea 06/30/2023   Essential hypertension 07/16/2021   Encounter for surveillance of injectable contraceptive 04/24/2020   Hot flashes 04/24/2020   Tobacco abuse 04/24/2020   Papanicolaou smear of cervix with positive high risk human papilloma virus (HPV) test 04/02/2019   Cigarette nicotine dependence without complication 12/08/2017   Osteoarthritis of knee 02/08/2017   Arthralgia of left knee 02/08/2017   Hyperlipemia 04/05/2015   GERD (gastroesophageal reflux disease) 04/05/2015   Morbid obesity (HCC) 04/05/2015   Migraine 07/28/2012   Depression 07/28/2012   Fibromyalgia 07/28/2012    Past Surgical History:  Procedure Laterality Date   BACK SURGERY     CESAREAN SECTION     CHOLECYSTECTOMY     TONSILLECTOMY      OB History     Gravida  2   Para  2   Term  2   Preterm      AB      Living  2      SAB      IAB      Ectopic      Multiple      Live Births  2  Home Medications    Prior to Admission medications  Medication Sig Start Date End Date Taking? Authorizing Provider  baclofen  (LIORESAL ) 10 MG tablet Take 1 tablet (10 mg total) by mouth 3 (three) times daily. 06/19/24  Yes Enedelia Dorna HERO, FNP  predniSONE  (DELTASONE ) 20 MG tablet Take 2 tablets (40 mg total) by mouth daily with breakfast for 5 days. 06/19/24 06/24/24 Yes StanhopeDorna HERO, FNP  amLODipine  (NORVASC ) 5 MG tablet Take 1 tablet (5 mg total) by mouth daily. 05/22/24   Patel, Rutwik K, MD  Cetirizine HCl (ZYRTEC PO) Take by mouth.    [provider]  lansoprazole  (PREVACID ) 30 MG capsule TAKE 1 CAPSULE BY MOUTH DAILY AT 12 NOON. 03/04/24   Tobie Suzzane POUR, MD  medroxyPROGESTERone  (DEPO-PROVERA ) 150 MG/ML injection Inject 1 mL (150 mg total) into the muscle every 3 (three) months. 08/11/23   Signa Delon LABOR, NP  meloxicam  (MOBIC ) 15 MG  tablet Take 1 tablet (15 mg total) by mouth daily. 09/27/23   Tobie Suzzane POUR, MD  ondansetron  (ZOFRAN -ODT) 8 MG disintegrating tablet Take 1 tablet (8 mg total) by mouth every 8 (eight) hours as needed for nausea or vomiting. 12/29/23   Tobie Suzzane POUR, MD  rizatriptan  (MAXALT ) 10 MG tablet Take 1 tablet (10 mg total) by mouth as needed for migraine. May repeat in 2 hours if needed 06/30/23   Tobie Suzzane POUR, MD  semaglutide -weight management (WEGOVY ) 0.5 MG/0.5ML SOAJ SQ injection Inject 0.5 mg into the skin every 7 (seven) days. 05/22/24   Tobie Suzzane POUR, MD  semaglutide -weight management (WEGOVY ) 1 MG/0.5ML SOAJ SQ injection Inject 1 mg into the skin every 7 (seven) days. 06/17/24   Tobie Suzzane POUR, MD  topiramate  (TOPAMAX ) 50 MG tablet Take 1 tablet (50 mg total) by mouth daily. 05/22/24   Tobie Suzzane POUR, MD    Family History Family History  Problem Relation Age of Onset   Hypertension Other    Diabetes Other    Stroke Other    Heart disease Other    Cancer Paternal Grandmother        breast   Diabetes Father    Muscular dystrophy Mother    Muscular dystrophy Brother    Muscular dystrophy Sister    Muscular dystrophy Sister    Asthma Son    Other Son        allergies    Social History Social History[1]   Allergies   Bee venom, Ketorolac tromethamine, Sulfa antibiotics, Dust mite extract, Grass pollen([id number] vern), and Ketorolac tromethamine   Review of Systems Review of Systems Per HPI  Physical Exam Triage Vital Signs ED Triage Vitals  Encounter Vitals Group     BP 06/19/24 1234 (!) 146/95     Girls Systolic BP Percentile --      Girls Diastolic BP Percentile --      Boys Systolic BP Percentile --      Boys Diastolic BP Percentile --      Pulse Rate 06/19/24 1234 84     Resp 06/19/24 1234 20     Temp 06/19/24 1234 98.1 F (36.7 C)     Temp Source 06/19/24 1234 Oral     SpO2 06/19/24 1234 93 %     Weight --      Height --      Head Circumference --       Peak Flow --      Pain Score 06/19/24 1229 7     Pain Loc --  Pain Education --      Exclude from Growth Chart --    No data found.  Updated Vital Signs BP (!) 146/95 (BP Location: Right Arm)   Pulse 84   Temp 98.1 F (36.7 C) (Oral)   Resp 20   SpO2 93%   Visual Acuity Right Eye Distance:   Left Eye Distance:   Bilateral Distance:    Right Eye Near:   Left Eye Near:    Bilateral Near:     Physical Exam Vitals and nursing note reviewed.  Constitutional:      Appearance: She is not ill-appearing or toxic-appearing.  HENT:     Head: Normocephalic and atraumatic.     Right Ear: Hearing and external ear normal.     Left Ear: Hearing and external ear normal.     Nose: Nose normal.     Mouth/Throat:     Lips: Pink.  Eyes:     General: Lids are normal. Vision grossly intact. Gaze aligned appropriately.     Extraocular Movements: Extraocular movements intact.     Conjunctiva/sclera: Conjunctivae normal.  Pulmonary:     Effort: Pulmonary effort is normal.  Musculoskeletal:     Cervical back: Neck supple.     Right upper leg: Normal.     Left upper leg: Normal.     Right knee: Normal.     Left knee: No swelling, deformity, effusion, erythema, ecchymosis, bony tenderness or crepitus. Normal range of motion (Normal ROM despite pain). Tenderness present over the medial joint line. No lateral joint line or patellar tendon tenderness. Normal alignment, normal meniscus and normal patellar mobility. Normal pulse.     Right lower leg: Normal.     Left lower leg: Normal.     Comments: +2 left popliteal pulse, 5/5 strength against resistance with flexion and extension at the left knee joint. Ambulatory with limp favoring left lower extremity.   Skin:    General: Skin is warm and dry.     Capillary Refill: Capillary refill takes less than 2 seconds.     Findings: No rash.  Neurological:     General: No focal deficit present.     Mental Status: She is alert and oriented to  person, place, and time. Mental status is at baseline.     Cranial Nerves: No dysarthria or facial asymmetry.  Psychiatric:        Mood and Affect: Mood normal.        Speech: Speech normal.        Behavior: Behavior normal.        Thought Content: Thought content normal.        Judgment: Judgment normal.      UC Treatments / Results  Labs (all labs ordered are listed, but only abnormal results are displayed) Labs Reviewed - No data to display  EKG   Radiology DG Knee Complete 4 Views Left Result Date: 06/19/2024 CLINICAL DATA:  Left knee pain for 3 days EXAM: LEFT KNEE - COMPLETE 4+ VIEW COMPARISON:  02/21/2023 FINDINGS: Similar degenerative changes of the knee throughout all 3 compartments with sclerosis and bony spurring. No acute osseous finding, fracture, or large effusion. Soft tissues unremarkable. Overall stable exam. IMPRESSION: Similar degenerative changes. No acute finding by plain radiography. Electronically Signed   By: CHRISTELLA.  Shick M.D.   On: 06/19/2024 12:50    Procedures Procedures (including critical care time)  Medications Ordered in UC Medications - No data to display  Initial Impression / Assessment  and Plan / UC Course  I have reviewed the triage vital signs and the nursing notes.  Pertinent labs & imaging results that were available during my care of the patient were reviewed by me and considered in my medical decision making (see chart for details).   1. Fall due to slipping on ice or snow, acute pain of left knee Left knee x-ray shows stable degenerative changes without acute bony abnormality. We will manage acute injury/pain as left knee sprain with RICE, knee brace (has one at home specifically for left knee), and ice/heat. Neurovascularly intact to distal LLE.  Recommend close follow-up with orthopedic provider in the next 3-5 days.  In the meantime, we will treat acute inflammation and pain with prednisone  burst and baclofen  muscle relaxer PRN.  Drowsiness precautions discussed regarding baclofen  use.  No Mobic /NSAID while taking prednisone .   Counseled patient on potential for adverse effects with medications prescribed/recommended today, strict ER and return-to-clinic precautions discussed, patient verbalized understanding.    Final Clinical Impressions(s) / UC Diagnoses   Final diagnoses:  Fall due to slipping on ice or snow, initial encounter  Acute pain of left knee     Discharge Instructions      Your knee x-rays are normal. Take prednisone  2 pills once daily for the next 5 days with breakfast (40mg  per dose). Take baclofen  1 pill every 8 hours as needed for muscle spasm- this will make you sleepy so mostly take this at bedtime.  Follow-up with orthopedic provider in the next 3-5 days.  Wear your knee brace for compression.  Elevate and apply ice/heat to the left knee 20 minutes on 20 minutes off as needed.   If you develop any new or worsening symptoms or if your symptoms do not start to improve, please return here or follow-up with your primary care provider. If your symptoms are severe, please go to the emergency room.   ED Prescriptions     Medication Sig Dispense Auth. Provider   predniSONE  (DELTASONE ) 20 MG tablet Take 2 tablets (40 mg total) by mouth daily with breakfast for 5 days. 10 tablet Enedelia Dorna HERO, FNP   baclofen  (LIORESAL ) 10 MG tablet Take 1 tablet (10 mg total) by mouth 3 (three) times daily. 30 each Enedelia Dorna HERO, FNP      PDMP not reviewed this encounter.    [1]  Social History Tobacco Use   Smoking status: Every Day    Current packs/day: 0.25    Average packs/day: 0.3 packs/day for 23.0 years (5.8 ttl pk-yrs)    Types: Cigarettes   Smokeless tobacco: Never  Vaping Use   Vaping status: Former  Substance Use Topics   Alcohol use: Not Currently   Drug use: No     Enedelia Dorna HERO, FNP 06/19/24 1527  "

## 2024-06-19 NOTE — Discharge Instructions (Addendum)
 Your knee x-rays are normal. Take prednisone  2 pills once daily for the next 5 days with breakfast (40mg  per dose). Take baclofen  1 pill every 8 hours as needed for muscle spasm- this will make you sleepy so mostly take this at bedtime.  Follow-up with orthopedic provider in the next 3-5 days.  Wear your knee brace for compression.  Elevate and apply ice/heat to the left knee 20 minutes on 20 minutes off as needed.   If you develop any new or worsening symptoms or if your symptoms do not start to improve, please return here or follow-up with your primary care provider. If your symptoms are severe, please go to the emergency room.

## 2024-06-20 ENCOUNTER — Other Ambulatory Visit (HOSPITAL_BASED_OUTPATIENT_CLINIC_OR_DEPARTMENT_OTHER): Payer: Self-pay

## 2024-06-21 ENCOUNTER — Ambulatory Visit: Admitting: Orthopedic Surgery

## 2024-06-21 ENCOUNTER — Other Ambulatory Visit (HOSPITAL_BASED_OUTPATIENT_CLINIC_OR_DEPARTMENT_OTHER): Payer: Self-pay

## 2024-06-21 ENCOUNTER — Encounter: Payer: Self-pay | Admitting: Orthopedic Surgery

## 2024-06-21 VITALS — BP 146/95 | Ht 59.5 in | Wt 258.0 lb

## 2024-06-21 DIAGNOSIS — S838X2A Sprain of other specified parts of left knee, initial encounter: Secondary | ICD-10-CM

## 2024-06-21 NOTE — Patient Instructions (Signed)
 BRACE   ICE  FINISH PREDNISONE  THEN START IBUPROFEN    NOTE FOR NO STEP X 4 WEEKS

## 2024-06-21 NOTE — Progress Notes (Signed)
" ° °  Patient: Jennifer Gonzales           Date of Birth: 1980/10/26           MRN: [id number] Visit Date: 06/21/2024 Requested by: Tobie Suzzane POUR, MD [address] [place],  [place] [address] PCP: Tobie Suzzane POUR, MD  Encounter Diagnosis  Name Primary?   Sprain of other ligament of left knee, initial encounter: MPFL Yes    Assessment and plan:  44 year old female history of osteoarthritis of the knee previously treated with NSAIDs injection and MRI in 2025 new injury rule out patellofemoral subluxation  Follow-up next week  [employer] hinged brace  No steps at work  [employer] and prednisone  then resume ibuprofen   No orders of the defined types were placed in this encounter.    Chief Complaint  Patient presents with   Knee Pain    Left fell 06/19/24 xrays at Urgent care     History:  44 year old female presents with a history of previous care by Dr. JAYSON.  She had an MRI in 2025 she has osteoarthritis of the knee she had NSAIDs and injection with pretty good relief of symptoms  However she had a new fall her knee sort of went into a valgus position she said her leg was sort of externally rotated she complains of medial peripatellar pain  She was injured on the fourth she is here on the 6 for evaluation and management  Focused exam findings:  BP (!) 146/95 Comment: 06/19/24  Ht 4' 11.5 (1.511 m)   Wt 258 lb (117 kg)   BMI 51.24 kg/m    Left knee joint effusion definitely noted tenderness over the medial patellofemoral ligaments the remaining portions of the knee are nontender she does not appear to have cruciate ligament or collateral ligament injury she can bend her knee straighten it there does not appear to be joint line symptoms  No results found.   I was able to obtain the outside imaging studies and there is no acute fracture or dislocation but she has fairly large amount of arthritis for age 45 "

## 2024-06-21 NOTE — Progress Notes (Signed)
" °  Intake history:  Chief Complaint  Patient presents with   Knee Pain    Left fell 06/19/24 xrays at Urgent care      BP (!) 146/95 Comment: 06/19/24  Ht 4' 11.5 (1.511 m)   Wt 258 lb (117 kg)   BMI 51.24 kg/m  Body mass index is 51.24 kg/m.  Pharmacy? _____[facility]_  WHAT ARE WE SEEING YOU FOR TODAY?   Left knee pain swelling popping   How long has this bothered you? (DOI?DOS?WS?)  on 06/19/24   Was there an injury? Yes  Anticoag.  No   Any ALLERGIES _____________Allergies[1] _________________________________   Treatment:  Have you taken:  Tylenol  No  Advil  No  Had PT No  Had injection No  Other  ___has been on Meloxicam  but on hold due to put on Prednisone  for 5 days and also on Baclofen  now ______________________        [1]  Allergies Allergen Reactions   Bee Venom Anaphylaxis   Ketorolac Tromethamine Other (See Comments)    Toradol: Hallucinations, alters mental status   Sulfa Antibiotics Nausea And Vomiting   Dust Mite Extract Other (See Comments)    Itchy throat, sneezing, itchy eyes, cough   Grass Pollen(K-O-R-T-Swt Vern) Other (See Comments)    Itchy throat, sneezing, itchy eyes, cough   Ketorolac Tromethamine Other (See Comments)    Toradol: Hallucinations, alters mental status   "

## 2024-07-01 ENCOUNTER — Ambulatory Visit: Admitting: Orthopedic Surgery

## 2024-07-17 ENCOUNTER — Ambulatory Visit

## 2024-09-20 ENCOUNTER — Ambulatory Visit: Payer: Self-pay | Admitting: Internal Medicine
